# Patient Record
Sex: Male | Born: 1960
Health system: Southern US, Community
[De-identification: ages and names within clinical notes are randomized; demographics above are authoritative.]

## PROBLEM LIST (undated history)

## (undated) DIAGNOSIS — I209 Angina pectoris, unspecified: Secondary | ICD-10-CM

## (undated) DIAGNOSIS — I739 Peripheral vascular disease, unspecified: Secondary | ICD-10-CM

## (undated) DIAGNOSIS — N3281 Overactive bladder: Secondary | ICD-10-CM

## (undated) DIAGNOSIS — E785 Hyperlipidemia, unspecified: Secondary | ICD-10-CM

## (undated) DIAGNOSIS — R0989 Other specified symptoms and signs involving the circulatory and respiratory systems: Secondary | ICD-10-CM

## (undated) DIAGNOSIS — N400 Enlarged prostate without lower urinary tract symptoms: Secondary | ICD-10-CM

## (undated) DIAGNOSIS — I82409 Acute embolism and thrombosis of unspecified deep veins of unspecified lower extremity: Secondary | ICD-10-CM

## (undated) DIAGNOSIS — K219 Gastro-esophageal reflux disease without esophagitis: Secondary | ICD-10-CM

## (undated) DIAGNOSIS — T7840XA Allergy, unspecified, initial encounter: Secondary | ICD-10-CM

## (undated) DIAGNOSIS — M1990 Unspecified osteoarthritis, unspecified site: Secondary | ICD-10-CM

## (undated) HISTORY — PX: CIRCUMCISION: SUR203

## (undated) HISTORY — DX: Allergy, unspecified, initial encounter: T78.40XA

## (undated) HISTORY — DX: Unspecified osteoarthritis, unspecified site: M19.90

## (undated) HISTORY — PX: VASCULAR SURGERY: SHX849

## (undated) HISTORY — PX: LASER ABLATION OF VASCULAR LESION: SHX1950

## (undated) HISTORY — DX: Hyperlipidemia, unspecified: E78.5

## (undated) HISTORY — DX: Gastro-esophageal reflux disease without esophagitis: K21.9

---

## 1997-12-22 HISTORY — PX: CARDIAC CATHETERIZATION: SHX172

## 1997-12-22 HISTORY — PX: CORONARY ANGIOPLASTY WITH STENT PLACEMENT: SHX49

## 2019-11-18 ENCOUNTER — Ambulatory Visit (HOSPITAL_COMMUNITY)
Admission: EM | Admit: 2019-11-18 | Discharge: 2019-11-18 | Disposition: A | Discharge status: Home / Self Care | Payer: Self-pay | Attending: Family Medicine | Admitting: Family Medicine

## 2019-11-18 ENCOUNTER — Encounter (HOSPITAL_COMMUNITY): Payer: Self-pay

## 2019-11-18 ENCOUNTER — Other Ambulatory Visit: Payer: Self-pay

## 2019-11-18 DIAGNOSIS — Z720 Tobacco use: Secondary | ICD-10-CM

## 2019-11-18 DIAGNOSIS — M7918 Myalgia, other site: Secondary | ICD-10-CM

## 2019-11-18 HISTORY — DX: Overactive bladder: N32.81

## 2019-11-18 HISTORY — DX: Other specified symptoms and signs involving the circulatory and respiratory systems: R09.89

## 2019-11-18 MED ORDER — CYCLOBENZAPRINE HCL 10 MG PO TABS: 10.0000 mg | ORAL_TABLET | Freq: Two times a day (BID) | ORAL | 0 refills | Status: DC | PRN

## 2019-11-18 MED ORDER — MELOXICAM 7.5 MG PO TABS: 7.5000 mg | ORAL_TABLET | Freq: Every day | ORAL | 0 refills | Status: DC
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
## 2019-11-22 ENCOUNTER — Ambulatory Visit (INDEPENDENT_AMBULATORY_CARE_PROVIDER_SITE_OTHER): Payer: Self-pay | Admitting: Family Medicine

## 2019-11-22 ENCOUNTER — Encounter: Payer: Self-pay | Admitting: Family Medicine

## 2019-11-22 ENCOUNTER — Other Ambulatory Visit: Payer: Self-pay

## 2019-11-22 DIAGNOSIS — M542 Cervicalgia: Secondary | ICD-10-CM

## 2019-11-22 DIAGNOSIS — M25511 Pain in right shoulder: Secondary | ICD-10-CM
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
## 2019-11-28 ENCOUNTER — Ambulatory Visit: Payer: Self-pay | Admitting: Physical Therapy

## 2019-12-01 ENCOUNTER — Encounter: Payer: Self-pay | Admitting: Physical Therapy

## 2019-12-01 ENCOUNTER — Other Ambulatory Visit: Payer: Self-pay

## 2019-12-01 ENCOUNTER — Ambulatory Visit: Payer: No Typology Code available for payment source | Attending: Family Medicine | Admitting: Physical Therapy

## 2019-12-01 DIAGNOSIS — M542 Cervicalgia: Secondary | ICD-10-CM | POA: Diagnosis present

## 2019-12-01 DIAGNOSIS — M62838 Other muscle spasm: Secondary | ICD-10-CM | POA: Diagnosis present

## 2019-12-01 DIAGNOSIS — M25511 Pain in right shoulder: Secondary | ICD-10-CM | POA: Diagnosis present

## 2019-12-01 DIAGNOSIS — R293 Abnormal posture: Secondary | ICD-10-CM | POA: Insufficient documentation
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
## 2019-12-05 ENCOUNTER — Encounter: Payer: Self-pay | Admitting: Physical Therapy

## 2019-12-05 ENCOUNTER — Ambulatory Visit: Payer: No Typology Code available for payment source | Admitting: Physical Therapy

## 2019-12-05 ENCOUNTER — Other Ambulatory Visit: Payer: Self-pay

## 2019-12-05 DIAGNOSIS — M542 Cervicalgia: Secondary | ICD-10-CM

## 2019-12-05 DIAGNOSIS — M62838 Other muscle spasm: Secondary | ICD-10-CM

## 2019-12-05 DIAGNOSIS — M25511 Pain in right shoulder: Secondary | ICD-10-CM

## 2019-12-05 DIAGNOSIS — R293 Abnormal posture: Secondary | ICD-10-CM
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
## 2019-12-06 ENCOUNTER — Ambulatory Visit: Payer: No Typology Code available for payment source

## 2019-12-08 ENCOUNTER — Ambulatory Visit: Payer: No Typology Code available for payment source

## 2019-12-08 ENCOUNTER — Other Ambulatory Visit: Payer: Self-pay

## 2019-12-08 DIAGNOSIS — M25511 Pain in right shoulder: Secondary | ICD-10-CM

## 2019-12-08 DIAGNOSIS — M62838 Other muscle spasm: Secondary | ICD-10-CM

## 2019-12-08 DIAGNOSIS — R293 Abnormal posture: Secondary | ICD-10-CM

## 2019-12-08 DIAGNOSIS — M542 Cervicalgia: Secondary | ICD-10-CM | POA: Diagnosis not present

## 2019-12-08 NOTE — Therapy (Signed)
[facility] [address]  [address] [place], [place], [address] Phone: [phone]   Fax:  [phone]  Physical Therapy Treatment  Patient Details  Name: Matthew Gaines MRN: [id number] Date of Birth: Jul 31, 1961 Referring Provider (PT): Lavada Mesi, MD   Encounter Date: 12/08/2019  PT End of Session - 12/08/19 1506    Visit Number  3    Number of Visits  13    Date for PT Re-Evaluation  01/12/20    Authorization Type  MVA/self pay    PT Start Time  1502   Pt. arrived 17 min late to session   PT Stop Time  1530    PT Time Calculation (min)  28 min    Activity Tolerance  Patient tolerated treatment well;Patient limited by pain    Behavior During Therapy  [facility] for tasks assessed/performed       Past Medical History:  Diagnosis Date  . Overactive bladder   . Poor circulation     Past Surgical History:  Procedure Laterality Date  . cardiac stint    . CIRCUMCISION    . LASER ABLATION OF VASCULAR LESION    . VASCULAR SURGERY      There were no vitals filed for this visit.  Subjective Assessment - 12/08/19 1511    Subjective  Pt. doing well.    Pertinent History  vascular surgery    Diagnostic tests  none    Patient Stated Goals  play tennis and basketball    Currently in Pain?  Yes    Pain Score  --   up to 8-9/10   Pain Location  Scapula    Pain Orientation  Right    Pain Descriptors / Indicators  Dull    Pain Type  Acute pain                       [facility] Adult PT Treatment/Exercise - 12/08/19 0001      Neck Exercises: Seated   Neck Retraction  10 reps    Neck Retraction Limitations  5" x 10 reps     Cervical Rotation  Right;Left;10 reps    Cervical Rotation Limitations  AROM       Shoulder Exercises: Seated   Retraction  Strengthening;Both;10 reps    Retraction Limitations  5" x 10 reps      Shoulder Exercises: Pulleys   Flexion  2 minutes    Flexion Limitations  to tolerance    Scaption  2 minutes    Scaption Limitations  to tolerance      Shoulder Exercises: Stretch   Other Shoulder Stretches  B UT, LS stretch x 30 sec      Other Shoulder Stretches  R lats stretch 2x30"; with rolling stool               PT Short Term Goals - 12/05/19 1701      PT SHORT TERM GOAL #1   Title  Patient to be independent with initial HEP.    Time  3    Period  Weeks    Status  On-going        PT Long Term Goals - 12/05/19 1701      PT LONG TERM GOAL #1   Title  Patient to be independent with advanced HEP.    Time  6    Period  Weeks    Status  On-going      PT  LONG TERM GOAL #2   Title  Patient to demonstrate B shoulder strength >/=4+/5.    Time  6    Period  Weeks    Status  On-going      PT LONG TERM GOAL #3   Title  Patient to demonstrate cervical and R shoulder AROM WFL and painless.    Time  6    Period  Weeks    Status  On-going      PT LONG TERM GOAL #4   Title  Patient to report 80% improvement in pain with lifting household objects overhead.    Time  6    Period  Weeks    Status  On-going      PT LONG TERM GOAL #5   Title  Patient to return to tennis and basketball with modifications as needed.    Time  6    Period  Weeks    Status  On-going            Plan - 12/08/19 1522    Clinical Impression Statement  Pt. arrived 17 min late to session thus treatment time limited.  Session focused on progression of cervical AROM activities and cervical/scapular flexibility.  Pt. able to demo good overall technique with scap. retraction, chin tucking HEP today.  Did require cueing at times for proper hold times with stretching as pt. tended to conversate and forgot proper hold times.  Ended visit with pt. noting pain returning to baseline.    Rehab Potential  Good    PT Treatment/Interventions  ADLs/Self Care Home Management;Cryotherapy;Electrical Stimulation;Iontophoresis 4mg /ml Dexamethasone;Moist Heat;Therapeutic exercise;Therapeutic  activities;Functional mobility training;Ultrasound;Neuromuscular re-education;Patient/family education;Manual techniques;Vasopneumatic Device;Taping;Energy conservation;Dry needling;Passive range of motion    PT Next Visit Plan  STM and postural strengthening    Consulted and Agree with Plan of Care  Patient       Patient will benefit from skilled therapeutic intervention in order to improve the following deficits and impairments:  Increased edema, Decreased activity tolerance, Decreased strength, Increased fascial restricitons, Impaired UE functional use, Pain, Increased muscle spasms, Improper body mechanics, Decreased range of motion, Impaired flexibility, Postural dysfunction  Visit Diagnosis: Cervicalgia  Acute pain of right shoulder  Abnormal posture  Other muscle spasm     Problem List There are no problems to display for this patient.   Bess Harvest, PTA 12/08/19 6:06 PM   [facility] [address]  [place], [place], [address] Phone: [phone]   Fax:  [phone]  Name: Durwin Davisson MRN: [id number] Date of Birth: 1961/12/02

## 2019-12-13 ENCOUNTER — Ambulatory Visit: Payer: No Typology Code available for payment source

## 2019-12-13 ENCOUNTER — Other Ambulatory Visit: Payer: Self-pay

## 2019-12-13 DIAGNOSIS — M542 Cervicalgia: Secondary | ICD-10-CM

## 2019-12-13 DIAGNOSIS — M62838 Other muscle spasm: Secondary | ICD-10-CM

## 2019-12-13 DIAGNOSIS — M25511 Pain in right shoulder: Secondary | ICD-10-CM

## 2019-12-13 DIAGNOSIS — R293 Abnormal posture: Secondary | ICD-10-CM

## 2019-12-13 NOTE — Therapy (Signed)
[facility] [address]  [place], [place], [address] Phone: [phone]   Fax:  [phone]  Physical Therapy Treatment  Patient Details  Name: Matthew Gaines MRN: [id number] Date of Birth: [birth year] Referring Provider (PT): Eunice Blase, MD   Encounter Date: 12/13/2019  PT End of Session - 12/13/19 1500    Visit Number  4    Number of Visits  13    Date for PT Re-Evaluation  01/12/20    Authorization Type  MVA/self pay    PT Start Time  3220   pt. arrived late to session   PT Stop Time  1532    PT Time Calculation (min)  38 min    Activity Tolerance  Patient tolerated treatment well;Patient limited by pain    Behavior During Therapy  [facility] for tasks assessed/performed       Past Medical History:  Diagnosis Date  . Overactive bladder   . Poor circulation     Past Surgical History:  Procedure Laterality Date  . cardiac stint    . CIRCUMCISION    . LASER ABLATION OF VASCULAR LESION    . VASCULAR SURGERY      There were no vitals filed for this visit.  Subjective Assessment - 12/13/19 1458    Subjective  Pt. requesting to have abbreviated therapy session today due to not sleeping well last night after helping wife in the middle of night last night.    Pertinent History  vascular surgery    Diagnostic tests  none    Patient Stated Goals  play tennis and basketball    Currently in Pain?  Yes    Pain Score  0-No pain   Pain rising to 7.5/10 at worst while helping wife to the bathroom last night   Pain Location  Scapula                       [facility] Adult PT Treatment/Exercise - 12/13/19 0001      Shoulder Exercises: Pulleys   Flexion  3 minutes    Flexion Limitations  to tolerance    Scaption  3 minutes    Scaption Limitations  to tolerance      Shoulder Exercises: ROM/Strengthening   Cybex Row  15 reps    Cybex Row Limitations  10# - low       Manual Therapy   Manual Therapy   Soft tissue mobilization;Myofascial release    Manual therapy comments  sitting     Soft tissue mobilization  STM to R UT, LS, R mid rhomboids, R teres minor, R infraspinatus    Myofascial Release  TPR to mid rhomboids, R UT, R teres minor, infraspinatus       Neck Exercises: Stretches   Upper Trapezius Stretch  Right;Left;1 rep;30 seconds    Upper Trapezius Stretch Limitations  B hands holding down on machine seat     Levator Stretch  Right;Left;1 rep;30 seconds    Levator Stretch Limitations  B hands holding down on machine seat    Corner Stretch  2 reps;30 seconds   low in doorway             PT Education - 12/13/19 1539    Education Details  HEP update;  posterior/inferior shoulder stretch    Person(s) Educated  Patient    Methods  Explanation;Demonstration;Verbal cues;Handout    Comprehension  Verbalized understanding;Returned demonstration;Verbal cues required  PT Short Term Goals - 12/13/19 1642      PT SHORT TERM GOAL #1   Title  Patient to be independent with initial HEP.    Time  3    Period  Weeks    Status  Achieved        PT Long Term Goals - 12/05/19 1701      PT LONG TERM GOAL #1   Title  Patient to be independent with advanced HEP.    Time  6    Period  Weeks    Status  On-going      PT LONG TERM GOAL #2   Title  Patient to demonstrate B shoulder strength >/=4+/5.    Time  6    Period  Weeks    Status  On-going      PT LONG TERM GOAL #3   Title  Patient to demonstrate cervical and R shoulder AROM WFL and painless.    Time  6    Period  Weeks    Status  On-going      PT LONG TERM GOAL #4   Title  Patient to report 80% improvement in pain with lifting household objects overhead.    Time  6    Period  Weeks    Status  On-going      PT LONG TERM GOAL #5   Title  Patient to return to tennis and basketball with modifications as needed.    Time  6    Period  Weeks    Status  On-going            Plan - 12/13/19 1512     Clinical Impression Statement  Tacari arrived late to session thus treatment time limited.  Tolerated progression of cervical stretching and addition of MT focused on R shoulder muscular guarding with good response.  Ended session today pain free and verbalized/demonstrating improved understanding of HEP.  Will continue to progress toward goals.    Rehab Potential  Good    PT Treatment/Interventions  ADLs/Self Care Home Management;Cryotherapy;Electrical Stimulation;Iontophoresis 4mg /ml Dexamethasone;Moist Heat;Therapeutic exercise;Therapeutic activities;Functional mobility training;Ultrasound;Neuromuscular re-education;Patient/family education;Manual techniques;Vasopneumatic Device;Taping;Energy conservation;Dry needling;Passive range of motion    PT Next Visit Plan  STM and postural strengthening    Consulted and Agree with Plan of Care  Patient       Patient will benefit from skilled therapeutic intervention in order to improve the following deficits and impairments:  Increased edema, Decreased activity tolerance, Decreased strength, Increased fascial restricitons, Impaired UE functional use, Pain, Increased muscle spasms, Improper body mechanics, Decreased range of motion, Impaired flexibility, Postural dysfunction  Visit Diagnosis: Cervicalgia  Acute pain of right shoulder  Abnormal posture  Other muscle spasm     Problem List There are no problems to display for this patient.   , PTA 12/13/19 6:24 PM   [facility] [address]  [address] [place], [place], [place] Phone: [phone]   Fax:  [phone]  Name: Matthew Gaines MRN: [name] Date of Birth: Mar 25, 1961

## 2019-12-15 ENCOUNTER — Ambulatory Visit: Payer: No Typology Code available for payment source | Admitting: Physical Therapy

## 2019-12-20 ENCOUNTER — Ambulatory Visit: Payer: No Typology Code available for payment source

## 2019-12-20 ENCOUNTER — Other Ambulatory Visit: Payer: Self-pay

## 2019-12-20 DIAGNOSIS — M62838 Other muscle spasm: Secondary | ICD-10-CM

## 2019-12-20 DIAGNOSIS — M25511 Pain in right shoulder: Secondary | ICD-10-CM

## 2019-12-20 DIAGNOSIS — R293 Abnormal posture: Secondary | ICD-10-CM

## 2019-12-20 DIAGNOSIS — M542 Cervicalgia: Secondary | ICD-10-CM

## 2019-12-20 NOTE — Therapy (Signed)
[facility] [address]  [place], [place], [address] Phone: [phone]   Fax:  [phone]  Physical Therapy Treatment  Patient Details  Name: Matthew Gaines MRN: [id number] Date of Birth: Jun 05, 1961 Referring Provider (PT): Eunice Blase, MD   Encounter Date: 12/20/2019  PT End of Session - 12/20/19 1504    Visit Number  5    Number of Visits  13    Date for PT Re-Evaluation  01/12/20    Authorization Type  MVA/self pay    PT Start Time  2505    PT Stop Time  1534    PT Time Calculation (min)  38 min    Activity Tolerance  Patient tolerated treatment well;Patient limited by pain    Behavior During Therapy  [facility] for tasks assessed/performed       Past Medical History:  Diagnosis Date  . Overactive bladder   . Poor circulation     Past Surgical History:  Procedure Laterality Date  . cardiac stint    . CIRCUMCISION    . LASER ABLATION OF VASCULAR LESION    . VASCULAR SURGERY      There were no vitals filed for this visit.  Subjective Assessment - 12/20/19 1502    Subjective  Pt. reporting he came straight to therapy from funeral for family member.    Pertinent History  vascular surgery    Diagnostic tests  none    Patient Stated Goals  play tennis and basketball    Currently in Pain?  No/denies    Pain Score  0-No pain   up to 7/10   Pain Location  Scapula    Pain Orientation  Right    Pain Descriptors / Indicators  Dull    Pain Type  Acute pain    Pain Radiating Towards  Towards R lats muscle    Multiple Pain Sites  Yes    Pain Score  0   up to 5/10 pain at worst   Pain Location  Neck    Pain Orientation  Right;Lower;Posterior    Pain Descriptors / Indicators  Dull;Sharp    Pain Type  Acute pain                       [facility] Adult PT Treatment/Exercise - 12/20/19 0001      Neck Exercises: Machines for Strengthening   UBE (Upper Arm Bike)  Lvl 1.0, 3 min forward/3 min  backwards       Neck Exercises: Theraband   Scapula Retraction  10 reps   terminated as pt. with R lats cramp x 2    Scapula Retraction Limitations  laying on pool noodle     Shoulder External Rotation  10 reps   yellow TB    Shoulder External Rotation Limitations  Hooklying on table     Horizontal ABduction  10 reps   red TB    Horizontal ABduction Limitations  Hooklying on table       Shoulder Exercises: Stretch   Corner Stretch  2 reps;30 seconds    Corner Stretch Limitations  low and mid     Other Shoulder Stretches  R lats stretch 2x30" with green p-ball rollouts       Manual Therapy   Manual Therapy  Soft tissue mobilization;Taping    Manual therapy comments  sitting     Soft tissue mobilization  STM to R distal lats  Kinesiotex  IT consultant  R lats k-taping pattern; "star pattern" (30% stretch all strips)      Neck Exercises: Stretches   Upper Trapezius Stretch  Right;Left;1 rep;30 seconds    Levator Stretch  Right;Left;1 rep;30 seconds    Chest Stretch  30 seconds;2 reps    Chest Stretch Limitations  snow angel position for chest stretch                PT Short Term Goals - 12/13/19 1642      PT SHORT TERM GOAL #1   Title  Patient to be independent with initial HEP.    Time  3    Period  Weeks    Status  Achieved        PT Long Term Goals - 12/05/19 1701      PT LONG TERM GOAL #1   Title  Patient to be independent with advanced HEP.    Time  6    Period  Weeks    Status  On-going      PT LONG TERM GOAL #2   Title  Patient to demonstrate B shoulder strength >/=4+/5.    Time  6    Period  Weeks    Status  On-going      PT LONG TERM GOAL #3   Title  Patient to demonstrate cervical and R shoulder AROM WFL and painless.    Time  6    Period  Weeks    Status  On-going      PT LONG TERM GOAL #4   Title  Patient to report 80% improvement in pain with lifting household objects overhead.    Time  6    Period   Weeks    Status  On-going      PT LONG TERM GOAL #5   Title  Patient to return to tennis and basketball with modifications as needed.    Time  6    Period  Weeks    Status  On-going            Plan - 12/20/19 1506    Clinical Impression Statement  Shady reporting some "popping" in shoulder.  Did have palpable "popping" in superior shoulder with therex today however this did not seem to limit him in session.  Did have R lats cramping x 2 with hooklying scapular activities thus adjusted for improved tolerance.  Required cueing to avoid pain and excessive overpressure with mid doorway pec stretch today.  Pt. verbalizing understanding of cueing today with good carryover.  Seems to be tolerating gentle cervical/shoulder ROM and gentle strengthening well.     Rehab Potential  Good    PT Treatment/Interventions  ADLs/Self Care Home Management;Cryotherapy;Electrical Stimulation;Iontophoresis 4mg /ml Dexamethasone;Moist Heat;Therapeutic exercise;Therapeutic activities;Functional mobility training;Ultrasound;Neuromuscular re-education;Patient/family education;Manual techniques;Vasopneumatic Device;Taping;Energy conservation;Dry needling;Passive range of motion    PT Next Visit Plan  STM and postural strengthening    Consulted and Agree with Plan of Care  Patient       Patient will benefit from skilled therapeutic intervention in order to improve the following deficits and impairments:  Increased edema, Decreased activity tolerance, Decreased strength, Increased fascial restricitons, Impaired UE functional use, Pain, Increased muscle spasms, Improper body mechanics, Decreased range of motion, Impaired flexibility, Postural dysfunction  Visit Diagnosis: Cervicalgia  Acute pain of right shoulder  Abnormal posture  Other muscle spasm     Problem List There are no problems to display  for this patient.   Kermit BaloMicah Toluwani Ruder, PTA 12/20/19 6:42 PM    [facility]  [facility] [address]  [address] [place], [place], [address] Phone: [phone]   Fax:  [phone]  Name: Matthew PrestoLarry Gaines MRN: [id number] Date of Birth: 11/23/1961

## 2019-12-22 ENCOUNTER — Ambulatory Visit: Payer: No Typology Code available for payment source | Admitting: Physical Therapy

## 2019-12-27 ENCOUNTER — Ambulatory Visit: Payer: 59 | Attending: Family Medicine

## 2019-12-27 ENCOUNTER — Ambulatory Visit: Payer: 59

## 2019-12-27 ENCOUNTER — Other Ambulatory Visit: Payer: Self-pay

## 2019-12-27 DIAGNOSIS — R293 Abnormal posture: Secondary | ICD-10-CM | POA: Insufficient documentation

## 2019-12-27 DIAGNOSIS — M25511 Pain in right shoulder: Secondary | ICD-10-CM | POA: Insufficient documentation

## 2019-12-27 DIAGNOSIS — M542 Cervicalgia: Secondary | ICD-10-CM | POA: Diagnosis not present

## 2019-12-27 DIAGNOSIS — M62838 Other muscle spasm: Secondary | ICD-10-CM

## 2019-12-27 NOTE — Therapy (Signed)
[facility] [address]  [place], [place], [address] Phone: [phone]   Fax:  [phone]  Physical Therapy Treatment  Patient Details  Name: Matthew Gaines MRN: [id number] Date of Birth: [birth year] Referring Provider (PT): Eunice Blase, MD   Encounter Date: 12/27/2019  PT End of Session - 12/27/19 1643    Visit Number  6    Number of Visits  13    Date for PT Re-Evaluation  01/12/20    Authorization Type  MVA/self pay    PT Start Time  1616    PT Stop Time  1715    PT Time Calculation (min)  59 min    Activity Tolerance  Patient tolerated treatment well;Patient limited by pain    Behavior During Therapy  [facility] for tasks assessed/performed       Past Medical History:  Diagnosis Date  . Overactive bladder   . Poor circulation     Past Surgical History:  Procedure Laterality Date  . cardiac stint    . CIRCUMCISION    . LASER ABLATION OF VASCULAR LESION    . VASCULAR SURGERY      There were no vitals filed for this visit.  Subjective Assessment - 12/27/19 1621    Subjective  Pt. reporting he is not having to care for his wife now.    Pertinent History  vascular surgery    Diagnostic tests  none    Patient Stated Goals  play tennis and basketball    Currently in Pain?  No/denies    Pain Score  0-No pain   Pain rising to 7/10 with removing christmas decor   Pain Location  Scapula    Pain Orientation  Right    Pain Radiating Towards  towards R lats muscle    Pain Onset  More than a month ago    Pain Frequency  Constant    Aggravating Factors   removing Christmas decor    Multiple Pain Sites  No                       [facility] Adult PT Treatment/Exercise - 12/27/19 0001      Neck Exercises: Machines for Strengthening   UBE (Upper Arm Bike)  Lvl 1.0, 3 min forward/3 min backwards     Cybex Row  10# - low row15 reps     Lat Pull  10# x narrow pulldown    Cues for full scap.  retraction/depression      Modalities   Modalities  Moist Heat;Electrical Stimulation      Moist Heat Therapy   Number Minutes Moist Heat  15 Minutes    Moist Heat Location  Shoulder   R UT/shoulder blade in seated      Electrical Stimulation   Electrical Stimulation Location  R shoulder blade     Electrical Stimulation Action  IFC    Electrical Stimulation Parameters  80-150Hz , intensity to pt. tolerance, 15'    Electrical Stimulation Goals  Pain      Manual Therapy   Manual Therapy  Soft tissue mobilization;Taping    Manual therapy comments  prone    Soft tissue mobilization  STM to R distal lats     Kinesiotex  IT sales professional  R lats k-taping pattern; "star pattern" (30% stretch all strips)   noted relief from K-taping to R lats  Neck Exercises: Stretches   Upper Trapezius Stretch  Right;Left;1 rep;30 seconds    Upper Trapezius Stretch Limitations  hands holding edge on edge of mat table     Levator Stretch  Right;Left;1 rep;30 seconds    Levator Stretch Limitations  hands holding onto edge of mat table     Corner Stretch  2 reps;30 seconds   on doorframe               PT Short Term Goals - 12/13/19 1642      PT SHORT TERM GOAL #1   Title  Patient to be independent with initial HEP.    Time  3    Period  Weeks    Status  Achieved        PT Long Term Goals - 12/05/19 1701      PT LONG TERM GOAL #1   Title  Patient to be independent with advanced HEP.    Time  6    Period  Weeks    Status  On-going      PT LONG TERM GOAL #2   Title  Patient to demonstrate B shoulder strength >/=4+/5.    Time  6    Period  Weeks    Status  On-going      PT LONG TERM GOAL #3   Title  Patient to demonstrate cervical and R shoulder AROM WFL and painless.    Time  6    Period  Weeks    Status  On-going      PT LONG TERM GOAL #4   Title  Patient to report 80% improvement in pain with lifting household objects overhead.    Time  6     Period  Weeks    Status  On-going      PT LONG TERM GOAL #5   Title  Patient to return to tennis and basketball with modifications as needed.    Time  6    Period  Weeks    Status  On-going            Plan - 12/27/19 1656    Clinical Impression Statement  Pt. reporting benefit from K-taping to R lats last session thus reapplied taping in session with no skin irritation visible.  MT addressing tenderness in R lats with gentle STM to this area which was tolerated well.  Progressed postural/scapular strengthening today with brief episode of R shoulder "popping" which was noted at 2/10 pain and quickly subsided.  Pt. with tenderness and increased tension in R UT, cervical musculature today thus trialed combo of moist heat/E-stim to R upper shoulder with good relief noted following these modalities.     Rehab Potential  Good    PT Treatment/Interventions  ADLs/Self Care Home Management;Cryotherapy;Electrical Stimulation;Iontophoresis 4mg /ml Dexamethasone;Moist Heat;Therapeutic exercise;Therapeutic activities;Functional mobility training;Ultrasound;Neuromuscular re-education;Patient/family education;Manual techniques;Vasopneumatic Device;Taping;Energy conservation;Dry needling;Passive range of motion    PT Next Visit Plan  STM and postural strengthening    Consulted and Agree with Plan of Care  Patient       Patient will benefit from skilled therapeutic intervention in order to improve the following deficits and impairments:  Increased edema, Decreased activity tolerance, Decreased strength, Increased fascial restricitons, Impaired UE functional use, Pain, Increased muscle spasms, Improper body mechanics, Decreased range of motion, Impaired flexibility, Postural dysfunction  Visit Diagnosis: Cervicalgia  Acute pain of right shoulder  Abnormal posture  Other muscle spasm     Problem List There are no problems to display for  this patient.   Kermit Balo, PTA 12/27/19 6:32  PM   [facility] [address]  [address] [place], [place], [address] Phone: [phone]   Fax:  [phone]  Name: Matthew Gaines MRN: [id number] Date of Birth: 11/19/1961

## 2019-12-27 NOTE — Patient Instructions (Signed)
TENS stands for Transcutaneous Electrical Nerve Stimulation. In other words, electrical impulses are allowed to pass through the skin in order to excite a nerve.  ° °Purpose and Use of TENS:  °TENS is a method used to manage acute and chronic pain without the use of drugs. It has been effective in managing pain associated with surgery, sprains, strains, trauma, rheumatoid arthritis, and neuralgias. It is a non-addictive, low risk, and non-invasive technique used to control pain. It is not, by any means, a curative form of treatment.  ° °How TENS Works:  °Most TENS units are a small pocket-sized unit powered by one 9 volt battery. Attached to the outside of the unit are two lead wires where two pins and/or snaps connect on each wire. All units come with a set of four reusable pads or electrodes. These are placed on the skin surrounding the area involved. By inserting the leads into  the pads, the electricity can pass from the unit making the circuit complete.  °As the intensity is turned up slowly, the electrical current enters the body from the electrodes through the skin to the surrounding nerve fibers. This triggers the release of hormones from within the body. These hormones contain pain relievers. By increasing the circulation of these hormones, the person’s pain may be lessened. It is also believed that the electrical stimulation itself helps to block the pain messages being sent to the brain, thus also decreasing the body’s perception of pain.  ° °Hazards:  °TENS units are NOT to be used by patients with PACEMAKERS, DEFIBRILLATORS, DIABETIC PUMPS, PREGNANT WOMEN, and patients with SEIZURE DISORDERS.  °TENS units are NOT to be used over the heart, throat, brain, or spinal cord.  °One of the major side effects from the TENS unit may be skin irritation. Some people may develop a rash if they are sensitive to the materials used in the electrodes or the connecting wires.  ° °Wear the unit for 15 min.  ° °Avoid  overuse due the body getting used to the stem making it not as effective over time.  ° °

## 2019-12-29 ENCOUNTER — Ambulatory Visit: Payer: 59 | Admitting: Physical Therapy

## 2019-12-29 ENCOUNTER — Ambulatory Visit: Payer: 59

## 2019-12-29 DIAGNOSIS — R293 Abnormal posture: Secondary | ICD-10-CM

## 2019-12-29 DIAGNOSIS — M62838 Other muscle spasm: Secondary | ICD-10-CM

## 2019-12-29 DIAGNOSIS — M25511 Pain in right shoulder: Secondary | ICD-10-CM

## 2019-12-29 DIAGNOSIS — M542 Cervicalgia: Secondary | ICD-10-CM
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
## 2020-01-03 ENCOUNTER — Ambulatory Visit: Payer: 59

## 2020-01-04 ENCOUNTER — Ambulatory Visit: Payer: 59

## 2020-01-04 ENCOUNTER — Other Ambulatory Visit: Payer: Self-pay

## 2020-01-04 DIAGNOSIS — M62838 Other muscle spasm: Secondary | ICD-10-CM

## 2020-01-04 DIAGNOSIS — M25511 Pain in right shoulder: Secondary | ICD-10-CM

## 2020-01-04 DIAGNOSIS — M542 Cervicalgia: Secondary | ICD-10-CM

## 2020-01-04 DIAGNOSIS — R293 Abnormal posture: Secondary | ICD-10-CM
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
## 2020-01-05 ENCOUNTER — Ambulatory Visit: Payer: 59 | Admitting: Physical Therapy

## 2020-01-09 ENCOUNTER — Telehealth: Payer: Self-pay | Admitting: Family Medicine

## 2020-01-09 NOTE — Telephone Encounter (Signed)
Left message on patient's voice mail to call back to discuss.

## 2020-01-09 NOTE — Telephone Encounter (Signed)
Patient called stated he finished all PT that was ordered.Wants to know what to do from here.  Please call [phone]

## 2020-01-09 NOTE — Telephone Encounter (Signed)
Patient called.  He was returning the call he missed earlier today.   Call back number: [phone]

## 2020-01-09 NOTE — Telephone Encounter (Signed)
I tried calling the patient again - reached his voice mail. I will try again later.

## 2020-01-09 NOTE — Telephone Encounter (Signed)
I called the patient. He had some improvement with PT, but continues to have pain and clicking in the right shoulder. Scheduled an appointment with Dr. Prince Rome for follow up/possible xrays of both the neck and right shoulder tomorrow afternoon at 3:00.

## 2020-01-09 NOTE — Telephone Encounter (Signed)
I spoke with the patient. See other message on this from today. 

## 2020-01-10 ENCOUNTER — Ambulatory Visit: Payer: Self-pay | Admitting: Family Medicine

## 2020-01-11 ENCOUNTER — Encounter: Payer: Self-pay | Admitting: Family Medicine

## 2020-01-11 ENCOUNTER — Ambulatory Visit (INDEPENDENT_AMBULATORY_CARE_PROVIDER_SITE_OTHER): Payer: 59 | Admitting: Family Medicine

## 2020-01-11 ENCOUNTER — Other Ambulatory Visit: Payer: Self-pay

## 2020-01-11 DIAGNOSIS — M542 Cervicalgia: Secondary | ICD-10-CM

## 2020-01-11 DIAGNOSIS — M25511 Pain in right shoulder: Secondary | ICD-10-CM
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
## 2020-01-14 ENCOUNTER — Encounter (HOSPITAL_BASED_OUTPATIENT_CLINIC_OR_DEPARTMENT_OTHER): Payer: Self-pay

## 2020-01-14 ENCOUNTER — Emergency Department (HOSPITAL_BASED_OUTPATIENT_CLINIC_OR_DEPARTMENT_OTHER)
Admission: EM | Admit: 2020-01-14 | Discharge: 2020-01-14 | Disposition: A | Discharge status: Home / Self Care | Payer: 59 | Attending: Emergency Medicine | Admitting: Emergency Medicine

## 2020-01-14 ENCOUNTER — Other Ambulatory Visit: Payer: Self-pay

## 2020-01-14 DIAGNOSIS — R360 Urethral discharge without blood: Secondary | ICD-10-CM | POA: Insufficient documentation

## 2020-01-14 DIAGNOSIS — R300 Dysuria: Secondary | ICD-10-CM | POA: Insufficient documentation

## 2020-01-14 DIAGNOSIS — Z202 Contact with and (suspected) exposure to infections with a predominantly sexual mode of transmission: Secondary | ICD-10-CM | POA: Diagnosis not present

## 2020-01-14 LAB — URINALYSIS, MICROSCOPIC (REFLEX): WBC, UA: >50 WBC/hpf (ref 0–5)

## 2020-01-14 LAB — URINALYSIS, ROUTINE W REFLEX MICROSCOPIC
Bilirubin Urine: NEGATIVE
Glucose, UA: NEGATIVE mg/dL
Hgb urine dipstick: NEGATIVE
Ketones, ur: NEGATIVE mg/dL
Nitrite: NEGATIVE
Protein, ur: NEGATIVE mg/dL
Specific Gravity, Urine: 1.025 (ref 1.005–1.030)
pH: 6 (ref 5.0–8.0)

## 2020-01-14 MED ORDER — AZITHROMYCIN 1 G PO PACK
1.0000 g | PACK | Freq: Once | ORAL | Status: AC
  Administered 2020-01-14: 1 g via ORAL
  Filled 2020-01-14: qty 1

## 2020-01-14 MED ORDER — CEFTRIAXONE SODIUM 500 MG IJ SOLR
500.0000 mg | Freq: Once | INTRAMUSCULAR | Status: AC
  Administered 2020-01-14: 500 mg via INTRAMUSCULAR
  Filled 2020-01-14: qty 500

## 2020-01-14 NOTE — Discharge Instructions (Addendum)
You have been treated presumptively today for gonorrhea and chlamydia.   You have been tested today for gonorrhea and chlamydia. These results will be available in approximately 3 days. You may check your MyChart account for results. Please inform all sexual partners of positive results and that they should be tested and treated as well.  Please wait 2 weeks and be sure that you and your partners are symptom free before returning to sexual activity. Please use protection with every sexual encounter.  Follow Up: Please followup with your primary doctor in 3 days for discussion of your diagnoses and further evaluation after today's visit; if you do not have a primary care doctor use the resource guide provided to find one; Please return to the ER for worsening symptoms, high fevers or persistent vomiting.  Please try calling [phone] to determine if [facility] accepts your insurance.  You can request Matthew Gaines or any of the providers there.  Your best bet however is to contact your insurance company to determine who in the area accepts your insurance. It is imperative that you get established with somebody, particularly if you would like PSA testing.

## 2020-01-14 NOTE — ED Provider Notes (Signed)
[facility] EMERGENCY DEPARTMENT Provider Note   CSN: [id number] Arrival date & time: 01/14/20  1000     History Chief Complaint  Patient presents with  . Exposure to STD    Matthew Gaines is a 59 y.o. male with no significant PMH who presents to the ED with 4-day history of mild penile discharge, testicular swelling, and dysuria.  Patient reports that he has been sexually active with multiple partners in the past 6 weeks and has not used protection.  He denies any fevers or chills, pain with defecation, abdominal pain, nausea or vomiting, bloody stool, hematuria, increased urinary frequency, or other symptoms.  He is requesting STI testing.  HPI     Past Medical History:  Diagnosis Date  . Overactive bladder   . Poor circulation     There are no problems to display for this patient.   Past Surgical History:  Procedure Laterality Date  . cardiac stint    . CIRCUMCISION    . LASER ABLATION OF VASCULAR LESION    . VASCULAR SURGERY         Family History  Family history unknown: Yes    Social History   Tobacco Use  . Smoking status: Current Some Day Smoker  . Smokeless tobacco: Never Used  Substance Use Topics  . Alcohol use: Never  . Drug use: Never    Home Medications Prior to Admission medications   Medication Sig Start Date End Date Taking? Authorizing Provider  cyclobenzaprine (FLEXERIL) 10 MG tablet Take 1 tablet (10 mg total) by mouth 2 (two) times daily as needed for muscle spasms. Patient not taking: Reported on 12/01/2019 11/18/19   Scot Jun, FNP  meloxicam (MOBIC) 7.5 MG tablet Take 1 tablet (7.5 mg total) by mouth daily. Patient not taking: Reported on 12/01/2019 11/18/19   Scot Jun, FNP    Allergies    Patient has no known allergies.  Review of Systems   Review of Systems  Constitutional: Negative for chills and fever.  Gastrointestinal: Negative for abdominal pain and nausea.  Genitourinary: Positive for  discharge. Negative for genital sores.    Physical Exam Updated Vital Signs BP 127/88 (BP Location: Right Arm)   Temp 98.2 F (36.8 C) (Oral)   Resp 18   Ht 6\' 2"  (1.88 m)   Wt 79.4 kg   BMI 22.47 kg/m   Physical Exam Vitals and nursing note reviewed. Exam conducted with a chaperone present.  Constitutional:      Appearance: Normal appearance. He is not ill-appearing.  HENT:     Head: Normocephalic and atraumatic.  Eyes:     General: No scleral icterus.    Conjunctiva/sclera: Conjunctivae normal.  Cardiovascular:     Rate and Rhythm: Normal rate and regular rhythm.     Pulses: Normal pulses.     Heart sounds: Normal heart sounds.  Pulmonary:     Effort: Pulmonary effort is normal. No respiratory distress.     Breath sounds: Normal breath sounds.  Abdominal:     General: Abdomen is flat. There is no distension.     Palpations: Abdomen is soft.     Tenderness: There is no abdominal tenderness. There is no guarding.  Genitourinary:    Comments: No significant penile discharge.  Circumcised.  No rashes.  No vesicular lesions.  No testicular swelling or tenderness to palpation.  Mild inguinal lymphadenopathy. Musculoskeletal:     Cervical back: Normal range of motion and neck supple. No rigidity.  Skin:    General: Skin is dry.  Neurological:     Mental Status: He is alert.     GCS: GCS eye subscore is 4. GCS verbal subscore is 5. GCS motor subscore is 6.  Psychiatric:        Mood and Affect: Mood normal.        Behavior: Behavior normal.        Thought Content: Thought content normal.     ED Results / Procedures / Treatments   Labs (all labs ordered are listed, but only abnormal results are displayed) Labs Reviewed  URINALYSIS, ROUTINE W REFLEX MICROSCOPIC - Abnormal; Notable for the following components:      Result Value   APPearance CLOUDY (*)    Leukocytes,Ua TRACE (*)    All other components within normal limits  URINALYSIS, MICROSCOPIC (REFLEX) - Abnormal;  Notable for the following components:   Bacteria, UA MANY (*)    All other components within normal limits  GC/CHLAMYDIA PROBE AMP ([place]) NOT AT [facility]    EKG None  Radiology No results found.  Procedures Procedures (including critical care time)  Medications Ordered in ED Medications  cefTRIAXone (ROCEPHIN) injection 500 mg (500 mg Intramuscular Given 01/14/20 1052)  azithromycin (ZITHROMAX) powder 1 g (1 g Oral Given 01/14/20 1051)    ED Course  I have reviewed the triage vital signs and the nursing notes.  Pertinent labs & imaging results that were available during my care of the patient were reviewed by me and considered in my medical decision making (see chart for details).    MDM Rules/Calculators/A&P                      Offered HIV and syphilis testing, to which patient declined.  Patient is requesting PSA testing and I encouraged him to get established with a primary care provider.  I encouraged him to contact his insurance company to find out which providers in the area accept his insurance.  His UA was unremarkable.  Patient is afebrile without abdominal tenderness, abdominal pain or painful bowel movements to indicate prostatitis.  No tenderness to palpation of the testes or epididymis to suggest orchitis or epididymitis.  STD cultures obtained including gonorrhea and chlamydia. Patient to be discharged with instructions to follow up with PCP. Discussed importance of using protection when sexually active. Pt understands that they have GC/Chlamydia cultures pending and that they will need to inform all sexual partners if results return positive. Patient has been treated prophylactically with azithromycin and Rocephin.    All of the evaluation and work-up results were discussed with the patient and any family at bedside. They were provided opportunity to ask any additional questions and have none at this time. They have expressed understanding of verbal discharge  instructions as well as return precautions and are agreeable to the plan.    Final Clinical Impression(s) / ED Diagnoses Final diagnoses:  Possible exposure to STD    Rx / DC Orders ED Discharge Orders    None       Lorelee New, PA-C 01/14/20 1106    Terrilee Files, MD 01/14/20 1734

## 2020-01-14 NOTE — ED Triage Notes (Signed)
Pt states that he is here today to be checked for an STD. States that someone he was with was positive for Gonorrhea.

## 2020-01-16 LAB — GC/CHLAMYDIA PROBE AMP (~~LOC~~) NOT AT ARMC
Chlamydia: NEGATIVE
Neisseria Gonorrhea: POSITIVE — AB

## 2020-01-17 ENCOUNTER — Other Ambulatory Visit: Payer: Self-pay

## 2020-01-17 ENCOUNTER — Ambulatory Visit: Payer: 59 | Attending: Family Medicine | Admitting: Physical Therapy

## 2020-01-17 ENCOUNTER — Encounter: Payer: Self-pay | Admitting: Physical Therapy

## 2020-01-17 DIAGNOSIS — R293 Abnormal posture: Secondary | ICD-10-CM | POA: Diagnosis present

## 2020-01-17 DIAGNOSIS — M62838 Other muscle spasm: Secondary | ICD-10-CM

## 2020-01-17 DIAGNOSIS — M542 Cervicalgia: Secondary | ICD-10-CM

## 2020-01-17 DIAGNOSIS — M25511 Pain in right shoulder: Secondary | ICD-10-CM
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
## 2020-01-24 ENCOUNTER — Ambulatory Visit: Payer: 59 | Attending: Family Medicine

## 2020-01-24 ENCOUNTER — Other Ambulatory Visit: Payer: Self-pay

## 2020-01-24 DIAGNOSIS — M542 Cervicalgia: Secondary | ICD-10-CM | POA: Diagnosis present

## 2020-01-24 DIAGNOSIS — M62838 Other muscle spasm: Secondary | ICD-10-CM | POA: Insufficient documentation

## 2020-01-24 DIAGNOSIS — R293 Abnormal posture: Secondary | ICD-10-CM | POA: Insufficient documentation

## 2020-01-24 DIAGNOSIS — M25511 Pain in right shoulder: Secondary | ICD-10-CM | POA: Insufficient documentation
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
## 2020-01-31 ENCOUNTER — Ambulatory Visit: Payer: 59

## 2020-02-01 ENCOUNTER — Ambulatory Visit: Payer: 59 | Admitting: Physical Therapy

## 2020-02-02 ENCOUNTER — Ambulatory Visit: Payer: 59

## 2020-02-07 ENCOUNTER — Other Ambulatory Visit: Payer: Self-pay

## 2020-02-07 ENCOUNTER — Ambulatory Visit: Payer: 59

## 2020-02-07 DIAGNOSIS — M25511 Pain in right shoulder: Secondary | ICD-10-CM | POA: Diagnosis not present

## 2020-02-07 DIAGNOSIS — R293 Abnormal posture: Secondary | ICD-10-CM

## 2020-02-07 DIAGNOSIS — M542 Cervicalgia: Secondary | ICD-10-CM

## 2020-02-07 DIAGNOSIS — M62838 Other muscle spasm: Secondary | ICD-10-CM
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
## 2020-02-11 ENCOUNTER — Other Ambulatory Visit: Payer: Self-pay

## 2020-02-11 ENCOUNTER — Encounter (HOSPITAL_BASED_OUTPATIENT_CLINIC_OR_DEPARTMENT_OTHER): Payer: Self-pay | Admitting: Emergency Medicine

## 2020-02-11 ENCOUNTER — Emergency Department (HOSPITAL_BASED_OUTPATIENT_CLINIC_OR_DEPARTMENT_OTHER)
Admission: EM | Admit: 2020-02-11 | Discharge: 2020-02-11 | Disposition: A | Discharge status: Home / Self Care | Payer: 59 | Attending: Emergency Medicine | Admitting: Emergency Medicine

## 2020-02-11 DIAGNOSIS — F17200 Nicotine dependence, unspecified, uncomplicated: Secondary | ICD-10-CM | POA: Diagnosis not present

## 2020-02-11 DIAGNOSIS — M545 Low back pain: Secondary | ICD-10-CM | POA: Insufficient documentation

## 2020-02-11 DIAGNOSIS — Y9389 Activity, other specified: Secondary | ICD-10-CM | POA: Diagnosis not present

## 2020-02-11 DIAGNOSIS — Y92410 Unspecified street and highway as the place of occurrence of the external cause: Secondary | ICD-10-CM | POA: Insufficient documentation

## 2020-02-11 DIAGNOSIS — M542 Cervicalgia: Secondary | ICD-10-CM | POA: Insufficient documentation

## 2020-02-11 DIAGNOSIS — Y999 Unspecified external cause status: Secondary | ICD-10-CM | POA: Insufficient documentation

## 2020-02-11 MED ORDER — METHOCARBAMOL 500 MG PO TABS: 500.0000 mg | ORAL_TABLET | Freq: Two times a day (BID) | ORAL | 0 refills | Status: DC

## 2020-02-11 MED ORDER — LIDOCAINE 5 % EX PTCH: 1.0000 | MEDICATED_PATCH | CUTANEOUS | 0 refills | Status: DC

## 2020-02-11 NOTE — ED Notes (Signed)
ED Provider at bedside. 

## 2020-02-11 NOTE — Discharge Instructions (Signed)
  Take it easy, but do not lay around too much as this may make any stiffness worse.  Antiinflammatory medications: Take 600 mg of ibuprofen every 6 hours or 440 mg (over the counter dose) to 500 mg (prescription dose) of naproxen every 12 hours for the next 3 days. After this time, these medications may be used as needed for pain. Take these medications with food to avoid upset stomach. Choose only one of these medications, do not take them together. Acetaminophen (generic for Tylenol): Should you continue to have additional pain while taking the ibuprofen or naproxen, you may add in acetaminophen as needed. Your daily total maximum amount of acetaminophen from all sources should be limited to 4000mg/day for persons without liver problems, or 2000mg/day for those with liver problems. Methocarbamol: Methocarbamol (generic for Robaxin) is a muscle relaxer and can help relieve stiff muscles or muscle spasms.  Do not drive or perform other dangerous activities while taking this medication as it can cause drowsiness as well as changes in reaction time and judgement. Lidocaine patches: These are available via either prescription or over-the-counter. The over-the-counter option may be more economical one and are likely just as effective. There are multiple over-the-counter brands, such as Salonpas. Ice: May apply ice to the area over the next 24 hours for 15 minutes at a time to reduce pain, inflammation, and swelling, if present. Exercises: Be sure to perform the attached exercises starting with three times a week and working up to performing them daily. This is an essential part of preventing long term problems.  Follow up: Follow up with a primary care provider for any future management of these complaints. Be sure to follow up within 7-10 days. Return: Return to the ED should symptoms worsen.  For prescription assistance, may try using prescription discount sites or apps, such as goodrx.com 

## 2020-02-11 NOTE — ED Notes (Signed)
Pt verbalized understanding d/c instructions. 
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
## 2020-02-11 NOTE — ED Notes (Signed)
Pt sitting in chair on phone.
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
## 2020-02-11 NOTE — ED Notes (Signed)
Pt denies numbness or tingling, reports "feeling stiff"

## 2020-02-14 ENCOUNTER — Ambulatory Visit: Payer: 59 | Admitting: Physical Therapy

## 2020-02-15 ENCOUNTER — Ambulatory Visit: Payer: 59

## 2020-02-15 ENCOUNTER — Other Ambulatory Visit: Payer: Self-pay

## 2020-02-15 DIAGNOSIS — M542 Cervicalgia: Secondary | ICD-10-CM

## 2020-02-15 DIAGNOSIS — M25511 Pain in right shoulder: Secondary | ICD-10-CM

## 2020-02-15 DIAGNOSIS — R293 Abnormal posture: Secondary | ICD-10-CM

## 2020-02-15 DIAGNOSIS — M62838 Other muscle spasm: Secondary | ICD-10-CM
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
## 2020-02-20 ENCOUNTER — Other Ambulatory Visit: Payer: Self-pay

## 2020-02-20 ENCOUNTER — Ambulatory Visit: Payer: 59 | Attending: Family Medicine

## 2020-02-20 DIAGNOSIS — M62838 Other muscle spasm: Secondary | ICD-10-CM | POA: Insufficient documentation

## 2020-02-20 DIAGNOSIS — R293 Abnormal posture: Secondary | ICD-10-CM | POA: Insufficient documentation

## 2020-02-20 DIAGNOSIS — M542 Cervicalgia: Secondary | ICD-10-CM | POA: Insufficient documentation

## 2020-02-20 DIAGNOSIS — M25511 Pain in right shoulder: Secondary | ICD-10-CM | POA: Insufficient documentation
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
## 2020-02-22 ENCOUNTER — Encounter: Payer: Self-pay | Admitting: Family Medicine

## 2020-02-22 ENCOUNTER — Ambulatory Visit (INDEPENDENT_AMBULATORY_CARE_PROVIDER_SITE_OTHER): Payer: 59 | Admitting: Family Medicine

## 2020-02-22 ENCOUNTER — Other Ambulatory Visit: Payer: Self-pay

## 2020-02-22 DIAGNOSIS — M25511 Pain in right shoulder: Secondary | ICD-10-CM | POA: Diagnosis not present

## 2020-02-22 DIAGNOSIS — M25512 Pain in left shoulder: Secondary | ICD-10-CM

## 2020-02-22 DIAGNOSIS — M542 Cervicalgia: Secondary | ICD-10-CM

## 2020-02-22 MED ORDER — BACLOFEN 10 MG PO TABS: 5.0000 mg | ORAL_TABLET | Freq: Three times a day (TID) | ORAL | 3 refills | Status: DC | PRN

## 2020-02-22 MED ORDER — NABUMETONE 750 MG PO TABS: 750.0000 mg | ORAL_TABLET | Freq: Two times a day (BID) | ORAL | 6 refills | Status: DC | PRN
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
## 2020-03-03 ENCOUNTER — Ambulatory Visit (HOSPITAL_BASED_OUTPATIENT_CLINIC_OR_DEPARTMENT_OTHER)
Admission: RE | Admit: 2020-03-03 | Discharge: 2020-03-03 | Disposition: A | Discharge status: Home / Self Care | Payer: 59 | Source: Ambulatory Visit | Attending: Family Medicine | Admitting: Family Medicine

## 2020-03-03 ENCOUNTER — Other Ambulatory Visit: Payer: Self-pay

## 2020-03-03 ENCOUNTER — Emergency Department (HOSPITAL_BASED_OUTPATIENT_CLINIC_OR_DEPARTMENT_OTHER): Admission: EM | Admit: 2020-03-03 | Discharge: 2020-03-03 | Discharge status: Absent without leave | Payer: 59

## 2020-03-03 DIAGNOSIS — M25512 Pain in left shoulder: Secondary | ICD-10-CM | POA: Diagnosis present

## 2020-03-05 ENCOUNTER — Telehealth: Payer: Self-pay | Admitting: Family Medicine

## 2020-03-05 NOTE — Telephone Encounter (Signed)
Patient aware of the results and will call back to schedule if he wants a cortisone injection

## 2020-03-05 NOTE — Telephone Encounter (Signed)
Shoulder MRI scan shows that the rotator cuff looks irritated but not torn.  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  No surgery indicated at this point.  If still having severe pain then we could contemplate cortisone injection.  Otherwise we will continue with physical therapy.

## 2020-03-06 ENCOUNTER — Other Ambulatory Visit: Payer: Self-pay

## 2020-03-06 ENCOUNTER — Ambulatory Visit (INDEPENDENT_AMBULATORY_CARE_PROVIDER_SITE_OTHER): Payer: 59 | Admitting: Family Medicine

## 2020-03-06 ENCOUNTER — Encounter: Payer: Self-pay | Admitting: Family Medicine

## 2020-03-06 ENCOUNTER — Ambulatory Visit: Payer: Self-pay

## 2020-03-06 DIAGNOSIS — M25512 Pain in left shoulder: Secondary | ICD-10-CM
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
## 2020-03-07 ENCOUNTER — Ambulatory Visit: Payer: 59 | Admitting: Physical Therapy

## 2020-03-14 ENCOUNTER — Other Ambulatory Visit: Payer: Self-pay

## 2020-03-14 ENCOUNTER — Encounter: Payer: Self-pay | Admitting: Physical Therapy

## 2020-03-14 ENCOUNTER — Ambulatory Visit: Payer: 59 | Admitting: Physical Therapy

## 2020-03-14 DIAGNOSIS — M542 Cervicalgia: Secondary | ICD-10-CM

## 2020-03-14 DIAGNOSIS — R293 Abnormal posture: Secondary | ICD-10-CM

## 2020-03-14 DIAGNOSIS — M25511 Pain in right shoulder: Secondary | ICD-10-CM

## 2020-03-14 DIAGNOSIS — M62838 Other muscle spasm: Secondary | ICD-10-CM
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
## 2020-03-15 ENCOUNTER — Ambulatory Visit (HOSPITAL_BASED_OUTPATIENT_CLINIC_OR_DEPARTMENT_OTHER)
Admission: RE | Admit: 2020-03-15 | Discharge: 2020-03-15 | Disposition: A | Discharge status: Home / Self Care | Payer: No Typology Code available for payment source | Source: Ambulatory Visit | Attending: Family Medicine | Admitting: Family Medicine

## 2020-03-15 DIAGNOSIS — M25512 Pain in left shoulder: Secondary | ICD-10-CM | POA: Insufficient documentation

## 2020-03-20 ENCOUNTER — Ambulatory Visit: Payer: 59 | Admitting: Physical Therapy

## 2020-03-21 ENCOUNTER — Ambulatory Visit: Payer: 59 | Admitting: Family Medicine

## 2020-03-26 ENCOUNTER — Ambulatory Visit: Payer: 59 | Admitting: Physical Therapy

## 2020-03-27 ENCOUNTER — Other Ambulatory Visit: Payer: Self-pay

## 2020-03-27 ENCOUNTER — Ambulatory Visit: Payer: Self-pay

## 2020-03-27 ENCOUNTER — Encounter: Payer: Self-pay | Admitting: Family Medicine

## 2020-03-27 ENCOUNTER — Ambulatory Visit (INDEPENDENT_AMBULATORY_CARE_PROVIDER_SITE_OTHER): Payer: 59 | Admitting: Family Medicine

## 2020-03-27 DIAGNOSIS — M25512 Pain in left shoulder: Secondary | ICD-10-CM
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
## 2020-03-30 ENCOUNTER — Other Ambulatory Visit: Payer: Self-pay

## 2020-04-02 ENCOUNTER — Ambulatory Visit (INDEPENDENT_AMBULATORY_CARE_PROVIDER_SITE_OTHER): Payer: 59 | Admitting: Medical

## 2020-04-02 ENCOUNTER — Other Ambulatory Visit: Payer: Self-pay

## 2020-04-02 ENCOUNTER — Encounter: Payer: Self-pay | Admitting: Medical

## 2020-04-02 VITALS — BP 110/70 | HR 72 | Temp 98.4°F | Resp 18 | Ht 73.0 in | Wt 150.8 lb

## 2020-04-02 DIAGNOSIS — R350 Frequency of micturition: Secondary | ICD-10-CM | POA: Diagnosis not present

## 2020-04-02 DIAGNOSIS — Z1211 Encounter for screening for malignant neoplasm of colon: Secondary | ICD-10-CM

## 2020-04-02 DIAGNOSIS — N400 Enlarged prostate without lower urinary tract symptoms: Secondary | ICD-10-CM

## 2020-04-02 DIAGNOSIS — Z0000 Encounter for general adult medical examination without abnormal findings: Secondary | ICD-10-CM

## 2020-04-02 DIAGNOSIS — I2510 Atherosclerotic heart disease of native coronary artery without angina pectoris: Secondary | ICD-10-CM

## 2020-04-02 DIAGNOSIS — E785 Hyperlipidemia, unspecified: Secondary | ICD-10-CM | POA: Diagnosis not present

## 2020-04-02 DIAGNOSIS — Z113 Encounter for screening for infections with a predominantly sexual mode of transmission: Secondary | ICD-10-CM

## 2020-04-02 DIAGNOSIS — Z125 Encounter for screening for malignant neoplasm of prostate: Secondary | ICD-10-CM

## 2020-04-02 DIAGNOSIS — I8393 Asymptomatic varicose veins of bilateral lower extremities: Secondary | ICD-10-CM

## 2020-04-02 LAB — COMPREHENSIVE METABOLIC PANEL
ALT: 14 U/L (ref 0–53)
AST: 14 U/L (ref 0–37)
Albumin: 4.5 g/dL (ref 3.5–5.2)
Alkaline Phosphatase: 61 U/L (ref 39–117)
BUN: 14 mg/dL (ref 6–23)
CO2: 32 mEq/L (ref 19–32)
Calcium: 10 mg/dL (ref 8.4–10.5)
Chloride: 101 mEq/L (ref 96–112)
Creatinine, Ser: 1.08 mg/dL (ref 0.40–1.50)
GFR: 84.29 mL/min (ref >60.00)
Glucose, Bld: 83 mg/dL (ref 70–99)
Potassium: 4.5 mEq/L (ref 3.5–5.1)
Sodium: 139 mEq/L (ref 135–145)
Total Bilirubin: 0.4 mg/dL (ref 0.2–1.2)
Total Protein: 6.8 g/dL (ref 6.0–8.3)

## 2020-04-02 LAB — POC URINALSYSI DIPSTICK (AUTOMATED)
Bilirubin, UA: NEGATIVE
Blood, UA: NEGATIVE
Glucose, UA: NEGATIVE
Ketones, UA: NEGATIVE
Leukocytes, UA: NEGATIVE
Nitrite, UA: NEGATIVE
Protein, UA: NEGATIVE
Spec Grav, UA: 1.025 (ref 1.010–1.025)
Urobilinogen, UA: 0.2 E.U./dL
pH, UA: 5.5 (ref 5.0–8.0)

## 2020-04-02 LAB — LIPID PANEL
Cholesterol: 189 mg/dL (ref 0–200)
HDL: 67.5 mg/dL (ref >39.00)
LDL Cholesterol: 112 mg/dL — ABNORMAL HIGH (ref 0–99)
NonHDL: 121.99
Total CHOL/HDL Ratio: 3
Triglycerides: 52 mg/dL (ref 0.0–149.0)
VLDL: 10.4 mg/dL (ref 0.0–40.0)

## 2020-04-02 LAB — CBC WITH DIFFERENTIAL/PLATELET
Basophils Absolute: 0 10*3/uL (ref 0.0–0.1)
Basophils Relative: 0.4 % (ref 0.0–3.0)
Eosinophils Absolute: 0.1 10*3/uL (ref 0.0–0.7)
Eosinophils Relative: 0.9 % (ref 0.0–5.0)
HCT: 49.4 % (ref 39.0–52.0)
Hemoglobin: 16.1 g/dL (ref 13.0–17.0)
Lymphocytes Relative: 24.9 % (ref 12.0–46.0)
Lymphs Abs: 1.5 10*3/uL (ref 0.7–4.0)
MCHC: 32.6 g/dL (ref 30.0–36.0)
MCV: 94.4 fl (ref 78.0–100.0)
Monocytes Absolute: 0.4 10*3/uL (ref 0.1–1.0)
Monocytes Relative: 7.7 % (ref 3.0–12.0)
Neutro Abs: 3.9 10*3/uL (ref 1.4–7.7)
Neutrophils Relative %: 66.1 % (ref 43.0–77.0)
Platelets: 216 10*3/uL (ref 150.0–400.0)
RBC: 5.23 Mil/uL (ref 4.22–5.81)
RDW: 14.4 % (ref 11.5–15.5)
WBC: 5.9 10*3/uL (ref 4.0–10.5)

## 2020-04-02 LAB — PSA: PSA: 1.65 ng/mL (ref 0.10–4.00)
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
## 2020-04-02 NOTE — Patient Instructions (Addendum)
For you wellness exam today I have ordered cbc, cmp, lipid panel, ua and hiv.  For hx of bph and prostatitis, will get psa, UA and plan to refer you back to urologist.  Vaccine tdap declined. He is thinking about getting covid vaccine. Not decided yet.  Recommend exercise and healthy diet.  We will let you know lab results as they come in.  Follow up date appointment will be determined after lab review.   For high cholesterol will follow lipid panel. Do recommend that you stop smoking.   With recent stress can give you medication if anxiety worsens. But try to revet to smoking.  Refer to screening colonoscopy.  Follow up date to be determined after lab review.   Preventive Care 75-54 Years Old, Male Preventive care refers to lifestyle choices and visits with your health care provider that can promote health and wellness. This includes:  A yearly physical exam. This is also called an annual well check.  Regular dental and eye exams.  Immunizations.  Screening for certain conditions.  Healthy lifestyle choices, such as eating a healthy diet, getting regular exercise, not using drugs or products that contain nicotine and tobacco, and limiting alcohol use. What can I expect for my preventive care visit? Physical exam Your health care provider will check:  Height and weight. These may be used to calculate body mass index (BMI), which is a measurement that tells if you are at a healthy weight.  Heart rate and blood pressure.  Your skin for abnormal spots. Counseling Your health care provider may ask you questions about:  Alcohol, tobacco, and drug use.  Emotional well-being.  Home and relationship well-being.  Sexual activity.  Eating habits.  Work and work Statistician. What immunizations do I need?  Influenza (flu) vaccine  This is recommended every year. Tetanus, diphtheria, and pertussis (Tdap) vaccine  You may need a Td booster every 10 years. Varicella  (chickenpox) vaccine  You may need this vaccine if you have not already been vaccinated. Zoster (shingles) vaccine  You may need this after age 12. Measles, mumps, and rubella (MMR) vaccine  You may need at least one dose of MMR if you were born in 1957 or later. You may also need a second dose. Pneumococcal conjugate (PCV13) vaccine  You may need this if you have certain conditions and were not previously vaccinated. Pneumococcal polysaccharide (PPSV23) vaccine  You may need one or two doses if you smoke cigarettes or if you have certain conditions. Meningococcal conjugate (MenACWY) vaccine  You may need this if you have certain conditions. Hepatitis A vaccine  You may need this if you have certain conditions or if you travel or work in places where you may be exposed to hepatitis A. Hepatitis B vaccine  You may need this if you have certain conditions or if you travel or work in places where you may be exposed to hepatitis B. Haemophilus influenzae type b (Hib) vaccine  You may need this if you have certain risk factors. Human papillomavirus (HPV) vaccine  If recommended by your health care provider, you may need three doses over 6 months. You may receive vaccines as individual doses or as more than one vaccine together in one shot (combination vaccines). Talk with your health care provider about the risks and benefits of combination vaccines. What tests do I need? Blood tests  Lipid and cholesterol levels. These may be checked every 5 years, or more frequently if you are over 64 years old.  Hepatitis C test.  Hepatitis B test. Screening  Lung cancer screening. You may have this screening every year starting at age 28 if you have a 30-pack-year history of smoking and currently smoke or have quit within the past 15 years.  Prostate cancer screening. Recommendations will vary depending on your family history and other risks.  Colorectal cancer screening. All adults should  have this screening starting at age 22 and continuing until age 52. Your health care provider may recommend screening at age 44 if you are at increased risk. You will have tests every 1-10 years, depending on your results and the type of screening test.  Diabetes screening. This is done by checking your blood sugar (glucose) after you have not eaten for a while (fasting). You may have this done every 1-3 years.  Sexually transmitted disease (STD) testing. Follow these instructions at home: Eating and drinking  Eat a diet that includes fresh fruits and vegetables, whole grains, lean protein, and low-fat dairy products.  Take vitamin and mineral supplements as recommended by your health care provider.  Do not drink alcohol if your health care provider tells you not to drink.  If you drink alcohol: ? Limit how much you have to 0-2 drinks a day. ? Be aware of how much alcohol is in your drink. In the [place] one drink equals one 12 oz bottle of beer (355 mL), one 5 oz glass of wine (148 mL), or one 1 oz glass of hard liquor (44 mL). Lifestyle  Take daily care of your teeth and gums.  Stay active. Exercise for at least 30 minutes on 5 or more days each week.  Do not use any products that contain nicotine or tobacco, such as cigarettes, e-cigarettes, and chewing tobacco. If you need help quitting, ask your health care provider.  If you are sexually active, practice safe sex. Use a condom or other form of protection to prevent STIs (sexually transmitted infections).  Talk with your health care provider about taking a low-dose aspirin every day starting at age 82. What's next?  Go to your health care provider once a year for a well check visit.  Ask your health care provider how often you should have your eyes and teeth checked.  Stay up to date on all vaccines. This information is not intended to replace advice given to you by your health care provider. Make sure you discuss any questions  you have with your health care provider. Document Revised: 12/02/2018 Document Reviewed: 12/02/2018 Elsevier Patient Education  2020 [employer].

## 2020-04-03 ENCOUNTER — Encounter: Payer: Self-pay | Admitting: Physical Therapy

## 2020-04-03 ENCOUNTER — Ambulatory Visit: Payer: 59 | Attending: Family Medicine | Admitting: Physical Therapy

## 2020-04-03 ENCOUNTER — Encounter: Payer: Self-pay | Admitting: Gastroenterology

## 2020-04-03 DIAGNOSIS — M25511 Pain in right shoulder: Secondary | ICD-10-CM | POA: Diagnosis present

## 2020-04-03 DIAGNOSIS — G8929 Other chronic pain: Secondary | ICD-10-CM | POA: Insufficient documentation

## 2020-04-03 DIAGNOSIS — R293 Abnormal posture: Secondary | ICD-10-CM | POA: Diagnosis present

## 2020-04-03 DIAGNOSIS — M25512 Pain in left shoulder: Secondary | ICD-10-CM | POA: Insufficient documentation

## 2020-04-03 LAB — HIV ANTIBODY (ROUTINE TESTING W REFLEX): HIV 1&2 Ab, 4th Generation: NONREACTIVE
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
## 2020-04-05 ENCOUNTER — Telehealth: Payer: Self-pay | Admitting: Medical

## 2020-04-05 ENCOUNTER — Other Ambulatory Visit: Payer: Self-pay

## 2020-04-05 ENCOUNTER — Ambulatory Visit: Payer: 59 | Admitting: Physical Therapy

## 2020-04-05 ENCOUNTER — Encounter: Payer: Self-pay | Admitting: Physical Therapy

## 2020-04-05 DIAGNOSIS — M25512 Pain in left shoulder: Secondary | ICD-10-CM

## 2020-04-05 DIAGNOSIS — R293 Abnormal posture: Secondary | ICD-10-CM

## 2020-04-05 DIAGNOSIS — G8929 Other chronic pain: Secondary | ICD-10-CM

## 2020-04-05 MED ORDER — TAMSULOSIN HCL 0.4 MG PO CAPS: 0.4000 mg | ORAL_CAPSULE | Freq: Every day | ORAL | 3 refills | Status: DC

## 2020-04-05 MED ORDER — ROSUVASTATIN CALCIUM 5 MG PO TABS: 5.0000 mg | ORAL_TABLET | Freq: Every day | ORAL | 3 refills | Status: DC

## 2020-04-05 NOTE — Patient Instructions (Signed)
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
## 2020-04-05 NOTE — Telephone Encounter (Signed)
Rx crestor and flomax sent to pt pharmacy.

## 2020-04-09 ENCOUNTER — Other Ambulatory Visit: Payer: Self-pay

## 2020-04-09 ENCOUNTER — Ambulatory Visit: Payer: 59

## 2020-04-09 DIAGNOSIS — R293 Abnormal posture: Secondary | ICD-10-CM

## 2020-04-09 DIAGNOSIS — M25512 Pain in left shoulder: Secondary | ICD-10-CM | POA: Diagnosis not present

## 2020-04-09 DIAGNOSIS — G8929 Other chronic pain: Secondary | ICD-10-CM
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
## 2020-04-10 ENCOUNTER — Telehealth: Payer: Self-pay | Admitting: Medical

## 2020-04-10 NOTE — Telephone Encounter (Signed)
Patient called to schedule with Dr. Servando Salina - advised we would need a referral. He will contact PCP to have referral sent over.

## 2020-04-12 ENCOUNTER — Other Ambulatory Visit: Payer: Self-pay

## 2020-04-12 ENCOUNTER — Ambulatory Visit: Payer: 59

## 2020-04-12 DIAGNOSIS — R293 Abnormal posture: Secondary | ICD-10-CM

## 2020-04-12 DIAGNOSIS — G8929 Other chronic pain: Secondary | ICD-10-CM

## 2020-04-12 DIAGNOSIS — M25512 Pain in left shoulder: Secondary | ICD-10-CM

## 2020-04-12 DIAGNOSIS — M25511 Pain in right shoulder: Secondary | ICD-10-CM
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
## 2020-04-12 NOTE — Patient Instructions (Signed)

## 2020-04-16 ENCOUNTER — Ambulatory Visit: Payer: 59

## 2020-04-16 ENCOUNTER — Other Ambulatory Visit: Payer: Self-pay

## 2020-04-16 DIAGNOSIS — R293 Abnormal posture: Secondary | ICD-10-CM

## 2020-04-16 DIAGNOSIS — G8929 Other chronic pain: Secondary | ICD-10-CM

## 2020-04-16 DIAGNOSIS — M25512 Pain in left shoulder: Secondary | ICD-10-CM | POA: Diagnosis not present
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
## 2020-04-17 ENCOUNTER — Telehealth: Payer: Self-pay

## 2020-04-19 ENCOUNTER — Ambulatory Visit (AMBULATORY_SURGERY_CENTER): Payer: Self-pay | Admitting: *Deleted

## 2020-04-19 ENCOUNTER — Telehealth: Payer: Self-pay | Admitting: *Deleted

## 2020-04-19 ENCOUNTER — Other Ambulatory Visit: Payer: Self-pay

## 2020-04-19 VITALS — Temp 97.7°F | Ht 73.0 in | Wt 148.0 lb

## 2020-04-19 DIAGNOSIS — Z01818 Encounter for other preprocedural examination: Secondary | ICD-10-CM

## 2020-04-19 DIAGNOSIS — Z1211 Encounter for screening for malignant neoplasm of colon: Secondary | ICD-10-CM

## 2020-04-19 MED ORDER — SUPREP BOWEL PREP KIT 17.5-3.13-1.6 GM/177ML PO SOLN: 1.0000 | Freq: Once | ORAL | 0 refills | Status: AC

## 2020-04-19 NOTE — Progress Notes (Signed)

## 2020-04-19 NOTE — Telephone Encounter (Signed)
Dr Barron Alvine,  I just saw this patient in PV-  He is a screening colon and is scheduled for 04-30-2020 Monday. During PV , he told me that in 1999 he had a cardiac cath with a stent placement - , he has had varicose vein thrombosis surgery in 2014/2015- he is a cyclist, very active, denies SOB or chest pain, no radiating pain however, he states he is having some same s/s as before when he had his stent placed- sharp pain in the chest at times, not consistent   -  He saw his PCP 03-2020 and was referred to a cardiovascular surgeon but has no appointment to see her yetPollie Friar) -  [place]   Does he need an OV with you prior to a colon or can we proceed as scheduled?  Please advise- Thanks, Elizebeth Brooking

## 2020-04-19 NOTE — Telephone Encounter (Signed)
Yes, with that medical history and sxs that he feels could be cardiac in nature, more prudent to delay this screening colo in favor or OV with me and appts with Cards. Thanks.

## 2020-04-19 NOTE — Telephone Encounter (Signed)
Tried pt- sch OC with Dr Barron Alvine- rang many times- no answer- no VM- will try pt Friday  Hilda Lias

## 2020-04-20 ENCOUNTER — Ambulatory Visit: Payer: 59

## 2020-04-20 NOTE — Telephone Encounter (Signed)
Called pt's number- no answer , no VM--  [facility] to [facility] ov with Dr Barron Alvine

## 2020-04-23 ENCOUNTER — Ambulatory Visit: Payer: 59 | Attending: Family Medicine

## 2020-04-24 NOTE — Telephone Encounter (Signed)
Attempted to reach pt- no answer and no voicemail set up.  LMOM on wife's machine to have pt call our office regarding appt.

## 2020-04-24 NOTE — Telephone Encounter (Signed)
Pt returning your call

## 2020-04-24 NOTE — Telephone Encounter (Signed)
Spoke with pt and explained that Dr. Barron Alvine would like an OV prior to colonoscopy.  Colonoscopy is cancelled for now and understanding voiced.  OV made for 06-13-20 at 900.  Letter mailed to pt with this date, time and location per his request

## 2020-04-25 ENCOUNTER — Other Ambulatory Visit: Payer: Self-pay

## 2020-04-25 ENCOUNTER — Emergency Department (HOSPITAL_COMMUNITY): Payer: 59

## 2020-04-25 ENCOUNTER — Encounter (HOSPITAL_COMMUNITY): Payer: Self-pay | Admitting: Internal Medicine

## 2020-04-25 ENCOUNTER — Observation Stay (HOSPITAL_COMMUNITY)
Admission: EM | Admit: 2020-04-25 | Discharge: 2020-04-26 | Disposition: A | Discharge status: Home / Self Care | Payer: 59 | Attending: Internal Medicine | Admitting: Internal Medicine

## 2020-04-25 DIAGNOSIS — N400 Enlarged prostate without lower urinary tract symptoms: Secondary | ICD-10-CM | POA: Diagnosis not present

## 2020-04-25 DIAGNOSIS — Z20822 Contact with and (suspected) exposure to covid-19: Secondary | ICD-10-CM | POA: Diagnosis not present

## 2020-04-25 DIAGNOSIS — I10 Essential (primary) hypertension: Secondary | ICD-10-CM | POA: Insufficient documentation

## 2020-04-25 DIAGNOSIS — F17210 Nicotine dependence, cigarettes, uncomplicated: Secondary | ICD-10-CM | POA: Insufficient documentation

## 2020-04-25 DIAGNOSIS — R079 Chest pain, unspecified: Secondary | ICD-10-CM

## 2020-04-25 DIAGNOSIS — R0789 Other chest pain: Secondary | ICD-10-CM | POA: Diagnosis not present

## 2020-04-25 DIAGNOSIS — E785 Hyperlipidemia, unspecified: Secondary | ICD-10-CM | POA: Insufficient documentation

## 2020-04-25 DIAGNOSIS — K219 Gastro-esophageal reflux disease without esophagitis: Secondary | ICD-10-CM | POA: Diagnosis not present

## 2020-04-25 DIAGNOSIS — Z7982 Long term (current) use of aspirin: Secondary | ICD-10-CM | POA: Diagnosis not present

## 2020-04-25 DIAGNOSIS — Z79899 Other long term (current) drug therapy: Secondary | ICD-10-CM | POA: Diagnosis not present

## 2020-04-25 DIAGNOSIS — Z955 Presence of coronary angioplasty implant and graft: Secondary | ICD-10-CM | POA: Insufficient documentation

## 2020-04-25 DIAGNOSIS — I2510 Atherosclerotic heart disease of native coronary artery without angina pectoris: Secondary | ICD-10-CM | POA: Diagnosis not present

## 2020-04-25 DIAGNOSIS — M1990 Unspecified osteoarthritis, unspecified site: Secondary | ICD-10-CM | POA: Diagnosis not present

## 2020-04-25 DIAGNOSIS — Z791 Long term (current) use of non-steroidal anti-inflammatories (NSAID): Secondary | ICD-10-CM | POA: Insufficient documentation

## 2020-04-25 HISTORY — DX: Chest pain, unspecified: R07.9

## 2020-04-25 LAB — BASIC METABOLIC PANEL
Anion gap: 10 (ref 5–15)
BUN: 15 mg/dL (ref 6–20)
CO2: 27 mmol/L (ref 22–32)
Calcium: 9.2 mg/dL (ref 8.9–10.3)
Chloride: 102 mmol/L (ref 98–111)
Creatinine, Ser: 1.09 mg/dL (ref 0.61–1.24)
GFR calc Af Amer: >60 mL/min (ref >60)
GFR calc non Af Amer: >60 mL/min (ref >60)
Glucose, Bld: 61 mg/dL — ABNORMAL LOW (ref 70–99)
Potassium: 4.1 mmol/L (ref 3.5–5.1)
Sodium: 139 mmol/L (ref 135–145)

## 2020-04-25 LAB — RESPIRATORY PANEL BY RT PCR (FLU A&B, COVID)
Influenza A by PCR: NEGATIVE
Influenza B by PCR: NEGATIVE
SARS Coronavirus 2 by RT PCR: NEGATIVE

## 2020-04-25 LAB — LIPID PANEL
Cholesterol: 184 mg/dL (ref 0–200)
HDL: 63 mg/dL (ref >40)
LDL Cholesterol: 113 mg/dL — ABNORMAL HIGH (ref 0–99)
Total CHOL/HDL Ratio: 2.9 RATIO
Triglycerides: 40 mg/dL (ref <150)
VLDL: 8 mg/dL (ref 0–40)

## 2020-04-25 LAB — CBC
HCT: 45 % (ref 39.0–52.0)
Hemoglobin: 14.7 g/dL (ref 13.0–17.0)
MCH: 30.8 pg (ref 26.0–34.0)
MCHC: 32.7 g/dL (ref 30.0–36.0)
MCV: 94.1 fL (ref 80.0–100.0)
Platelets: 231 10*3/uL (ref 150–400)
RBC: 4.78 MIL/uL (ref 4.22–5.81)
RDW: 14.2 % (ref 11.5–15.5)
WBC: 6.7 10*3/uL (ref 4.0–10.5)
nRBC: 0 % (ref 0.0–0.2)

## 2020-04-25 LAB — TROPONIN I (HIGH SENSITIVITY)
Troponin I (High Sensitivity): 3 ng/L (ref <18)
Troponin I (High Sensitivity): <2 ng/L (ref <18)

## 2020-04-25 LAB — CBG MONITORING, ED
Glucose-Capillary: 49 mg/dL — ABNORMAL LOW (ref 70–99)
Glucose-Capillary: 96 mg/dL (ref 70–99)

## 2020-04-25 LAB — SEDIMENTATION RATE: Sed Rate: 15 mm/hr (ref 0–16)

## 2020-04-25 MED ORDER — AMLODIPINE BESYLATE 5 MG PO TABS
5.0000 mg | ORAL_TABLET | Freq: Every day | ORAL | Status: DC
  Administered 2020-04-25 – 2020-04-26 (×2): 5 mg via ORAL
  Filled 2020-04-25 (×2): qty 1

## 2020-04-25 MED ORDER — TAMSULOSIN HCL 0.4 MG PO CAPS
0.4000 mg | ORAL_CAPSULE | Freq: Every day | ORAL | Status: DC
  Administered 2020-04-25 – 2020-04-26 (×2): 0.4 mg via ORAL
  Filled 2020-04-25 (×2): qty 1

## 2020-04-25 MED ORDER — ASPIRIN 81 MG PO CHEW
81.0000 mg | CHEWABLE_TABLET | Freq: Every day | ORAL | Status: DC
  Administered 2020-04-26: 81 mg via ORAL
  Filled 2020-04-25: qty 1

## 2020-04-25 MED ORDER — SODIUM CHLORIDE 0.9% FLUSH
3.0000 mL | Freq: Once | INTRAVENOUS | Status: AC
  Administered 2020-04-25: 3 mL via INTRAVENOUS

## 2020-04-25 MED ORDER — CYCLOBENZAPRINE HCL 10 MG PO TABS: 10.0000 mg | ORAL_TABLET | Freq: Three times a day (TID) | ORAL | Status: DC | PRN

## 2020-04-25 MED ORDER — MELOXICAM 7.5 MG PO TABS
7.5000 mg | ORAL_TABLET | Freq: Every day | ORAL | Status: DC
  Administered 2020-04-25 – 2020-04-26 (×2): 7.5 mg via ORAL
  Filled 2020-04-25 (×2): qty 1

## 2020-04-25 MED ORDER — ACETAMINOPHEN 650 MG RE SUPP: 650.0000 mg | Freq: Four times a day (QID) | RECTAL | Status: DC | PRN

## 2020-04-25 MED ORDER — NITROGLYCERIN 0.4 MG SL SUBL: 0.4000 mg | SUBLINGUAL_TABLET | SUBLINGUAL | Status: DC | PRN

## 2020-04-25 MED ORDER — ACETAMINOPHEN 325 MG PO TABS: 650.0000 mg | ORAL_TABLET | Freq: Four times a day (QID) | ORAL | Status: DC | PRN

## 2020-04-25 MED ORDER — ROSUVASTATIN CALCIUM 5 MG PO TABS
5.0000 mg | ORAL_TABLET | Freq: Every day | ORAL | Status: DC
  Administered 2020-04-25 – 2020-04-26 (×2): 5 mg via ORAL
  Filled 2020-04-25 (×2): qty 1

## 2020-04-25 MED ORDER — METHOCARBAMOL 500 MG PO TABS
500.0000 mg | ORAL_TABLET | Freq: Two times a day (BID) | ORAL | Status: DC
  Administered 2020-04-26: 500 mg via ORAL
  Filled 2020-04-25: qty 1

## 2020-04-25 MED ORDER — BACLOFEN 5 MG HALF TABLET: 5.0000 mg | ORAL_TABLET | Freq: Three times a day (TID) | ORAL | Status: DC | PRN

## 2020-04-25 NOTE — ED Notes (Signed)
No chest pain eating a big chicken meal

## 2020-04-25 NOTE — ED Notes (Signed)
MD Freida Busman notified of pt CBG. Pt provided with orange juice.

## 2020-04-25 NOTE — ED Triage Notes (Signed)
Pt arrives via ems with reports of intermittent chest pain worse when lying down. Pain in central, non radiating. Given 324mg  aspirin. 18g LFA.

## 2020-04-25 NOTE — ED Provider Notes (Signed)
[place] EMERGENCY DEPARTMENT Provider Note   CSN: [id number] Arrival date & time: 04/25/20  1305     History Chief Complaint  Patient presents with  . Chest Pain    Matthew Gaines is a 59 y.o. male history CAD, stent, GERD, hypertension, hyperlipidemia, smoker.  Patient reports intermittent chest pain, 4 episodes over the last 3 weeks.  Primarily occurs when lying down in the morning, gradually improves after sitting up and throughout the day.  Describes a sharp stabbing sensation left side of his chest nonradiating no clear aggravating or alleviating factors, associated with diaphoresis.  Last episode of pain began around 9 AM this morning and has since resolved.  Patient received full dose aspirin prior to arrival.  Of note patient was hypoglycemic in the waiting room, improved with orange juice.  He reports that he has not eaten today.  Denies fever/chills, headache, neck pain, cough/hemoptysis, shortness of breath, abdominal pain, nausea/vomiting, diarrhea, numbness/weakness, tingling, swelling/color change of the extremities or any additional concerns.  HPI     Past Medical History:  Diagnosis Date  . Allergy    Q 2-3 yrs has issues   . Arthritis   . GERD (gastroesophageal reflux disease)    past hx   . Hyperlipidemia   . Overactive bladder   . Poor circulation     There are no problems to display for this patient.   Past Surgical History:  Procedure Laterality Date  . CARDIAC CATHETERIZATION  1999  . CIRCUMCISION    . CORONARY ANGIOPLASTY WITH STENT PLACEMENT  1999  . LASER ABLATION OF VASCULAR LESION    . VASCULAR SURGERY         Family History  Problem Relation Age of Onset  . Lung cancer Mother   . Colon polyps Brother   . Colon cancer Neg Hx   . Esophageal cancer Neg Hx   . Rectal cancer Neg Hx   . Stomach cancer Neg Hx     Social History   Tobacco Use  . Smoking status: Current Some Day Smoker    Types: Cigarettes  .  Smokeless tobacco: Never Used  . Tobacco comment: 2-3 a day - some days none   Substance Use Topics  . Alcohol use: Yes    Comment: occ   . Drug use: Never    Home Medications Prior to Admission medications   Medication Sig Start Date End Date Taking? Authorizing Provider  aspirin 81 MG chewable tablet Chew by mouth daily.    [provider]  baclofen (LIORESAL) 10 MG tablet Take 0.5-1 tablets (5-10 mg total) by mouth 3 (three) times daily as needed for muscle spasms. Patient not taking: Reported on 03/27/2020 02/22/20   Hilts, Legrand Como, MD  cyclobenzaprine (FLEXERIL) 10 MG tablet Take 10 mg by mouth 3 (three) times daily as needed for muscle spasms.    [provider]  lidocaine (LIDODERM) 5 % Place 1 patch onto the skin daily. Remove & Discard patch within 12 hours or as directed by MD 02/11/20   Arlean Hopping C, PA-C  meloxicam (MOBIC) 7.5 MG tablet Take 1 tablet (7.5 mg total) by mouth daily. 11/18/19   Scot Jun, FNP  methocarbamol (ROBAXIN) 500 MG tablet Take 1 tablet (500 mg total) by mouth 2 (two) times daily. 02/11/20   Joy, Shawn C, PA-C  nabumetone (RELAFEN) 750 MG tablet Take 1 tablet (750 mg total) by mouth 2 (two) times daily as needed. Patient not taking: Reported on  03/27/2020 02/22/20   Hilts, Casimiro Needle, MD  rosuvastatin (CRESTOR) 5 MG tablet Take 1 tablet (5 mg total) by mouth daily. Patient not taking: Reported on 04/19/2020 04/05/20   Saguier, Ramon Dredge, PA-C  tamsulosin (FLOMAX) 0.4 MG CAPS capsule Take 1 capsule (0.4 mg total) by mouth daily. Patient not taking: Reported on 04/19/2020 04/05/20   Saguier, Ramon Dredge, PA-C    Allergies    Patient has no known allergies.  Review of Systems   Review of Systems Ten systems are reviewed and are negative for acute change except as noted in the HPI  Physical Exam Updated Vital Signs BP 118/83 (BP Location: Left Arm)   Pulse 61   Temp 98.4 F (36.9 C) (Oral)   Resp 14   SpO2 100%   Physical Exam Constitutional:       General: He is not in acute distress.    Appearance: Normal appearance. He is well-developed. He is not ill-appearing or diaphoretic.  HENT:     Head: Normocephalic and atraumatic.     Right Ear: External ear normal.     Left Ear: External ear normal.     Nose: Nose normal.  Eyes:     General: Vision grossly intact. Gaze aligned appropriately.     Pupils: Pupils are equal, round, and reactive to light.  Neck:     Trachea: Trachea and phonation normal. No tracheal deviation.  Cardiovascular:     Rate and Rhythm: Normal rate and regular rhythm.     Pulses:          Dorsalis pedis pulses are 2+ on the right side and 2+ on the left side.     Heart sounds: Normal heart sounds.  Pulmonary:     Effort: Pulmonary effort is normal. No respiratory distress.     Breath sounds: Normal breath sounds.  Abdominal:     General: There is no distension.     Palpations: Abdomen is soft. There is no pulsatile mass.     Tenderness: There is no abdominal tenderness. There is no guarding or rebound.  Musculoskeletal:        General: Normal range of motion.     Cervical back: Normal range of motion.     Right lower leg: No tenderness. No edema.     Left lower leg: No tenderness. No edema.  Skin:    General: Skin is warm and dry.  Neurological:     Mental Status: He is alert.     GCS: GCS eye subscore is 4. GCS verbal subscore is 5. GCS motor subscore is 6.     Comments: Speech is clear and goal oriented, follows commands Major Cranial nerves without deficit, no facial droop Moves extremities without ataxia, coordination intact  Psychiatric:        Behavior: Behavior normal.     ED Results / Procedures / Treatments   Labs (all labs ordered are listed, but only abnormal results are displayed) Labs Reviewed  BASIC METABOLIC PANEL - Abnormal; Notable for the following components:      Result Value   Glucose, Bld 61 (*)    All other components within normal limits  CBG MONITORING, ED -  Abnormal; Notable for the following components:   Glucose-Capillary 49 (*)    All other components within normal limits  RESPIRATORY PANEL BY RT PCR (FLU A&B, COVID)  CBC  CBG MONITORING, ED  TROPONIN I (HIGH SENSITIVITY)  TROPONIN I (HIGH SENSITIVITY)    EKG EKG Interpretation  Date/Time:  Wednesday Apr 25 2020 13:04:27 EDT Ventricular Rate:  66 PR Interval:  154 QRS Duration: 94 QT Interval:  394 QTC Calculation: 413 R Axis:   82 Text Interpretation: Sinus rhythm with Premature atrial complexes Early Repolarization Minimal voltage criteria for LVH, may be normal variant Borderline ECG Confirmed by Geoffery Lyons ([id number]) on 04/25/2020 5:38:45 PM   Radiology DG Chest 2 View  Result Date: 04/25/2020 CLINICAL DATA:  Chest pain for 1 day.  History of smoking. EXAM: CHEST - 2 VIEW COMPARISON:  None. FINDINGS: The heart size and mediastinal contours are within normal limits. Both lungs are clear. The visualized skeletal structures are unremarkable. IMPRESSION: No active cardiopulmonary disease. Electronically Signed   By: Norva Pavlov M.D.   On: 04/25/2020 13:43    Procedures Procedures (including critical care time)  Medications Ordered in ED Medications  sodium chloride flush (NS) 0.9 % injection 3 mL (has no administration in time range)    ED Course  I have reviewed the triage vital signs and the nursing notes.  Pertinent labs & imaging results that were available during my care of the patient were reviewed by me and considered in my medical decision making (see chart for details).  Clinical Course as of Apr 25 1817  Wed Apr 25, 2020  1759 Dr. Chipper Herb   [BM]    Clinical Course User Index [BM] Elizabeth Palau   MDM Rules/Calculators/A&P                     I reviewed and interpreted the following labs.  CBC shows no leukocytosis or evidence of anemia.  High-sensitivity troponin within normal limits.  CBG 96.  BMP shows prior hypoglycemia which is now improved  otherwise within normal limits no emergent electrolyte derangement or evidence of kidney injury.  Chest x-ray:  IMPRESSION:  No active cardiopulmonary disease.  I personally reviewed patient's chest x-ray and agree with radiologist interpretation.  EKG: Sinus rhythm with Premature atrial complexes Early Repolarization Minimal voltage criteria for LVH, may be normal variant Borderline ECG Confirmed by Geoffery Lyons ([id number]) on 04/25/2020 5:38:45 PM - Initially concerned patient's pain may be GI in nature as it is positional worse with lying down in the mornings however he declines this stating that this does not feel like previous.  Pain.  He reports that in the 1990s he had cardiac catheterization and a stent placed and his pain today feels very similar to that time.  He reports that he has not followed up well with cardiology, he is in the process of getting a cardiology referral from his primary care provider.  Patient is currently chest pain-free and had full dose aspirin prior to arrival.  Based on patient's risk factors and EKG heart score is elevated at 5 today, will discuss with hospitalist for admission.  Plan of care discussed with attending physician Dr. Judd Lien who agrees with plan.  Additionally patient's vital signs are stable in the ED, doubt PE, dissection, symptomatic anemia, pneumonia or other emergent cardiopulmonary etiologies at this time.  Screening Covid test ordered.  Patient and family are very hesitant about returning home today they are agreeable for admission.   = 5:59 PM: Discussed case with Dr. Chipper Herb who has accepted patient to hospitalist service.   Note: Portions of this report may have been transcribed using voice recognition software. Every effort was made to ensure accuracy; however, inadvertent computerized transcription errors may still be present. Final Clinical Impression(s) / ED Diagnoses Final  diagnoses:  Chest pain, unspecified type    Rx / DC Orders ED  Discharge Orders    None       Elizabeth Palau 04/25/20 1818    Geoffery Lyons, MD 04/26/20 [id number]

## 2020-04-25 NOTE — ED Notes (Signed)
Attempted to call report. RN will call back. 
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
## 2020-04-26 ENCOUNTER — Encounter: Payer: 59 | Admitting: Physical Therapy

## 2020-04-26 ENCOUNTER — Observation Stay (HOSPITAL_BASED_OUTPATIENT_CLINIC_OR_DEPARTMENT_OTHER): Payer: 59

## 2020-04-26 DIAGNOSIS — R079 Chest pain, unspecified: Secondary | ICD-10-CM

## 2020-04-26 LAB — ECHOCARDIOGRAM COMPLETE

## 2020-04-26 LAB — RAPID URINE DRUG SCREEN, HOSP PERFORMED
Amphetamines: NOT DETECTED
Barbiturates: NOT DETECTED
Benzodiazepines: NOT DETECTED
Cocaine: POSITIVE — AB
Opiates: NOT DETECTED
Tetrahydrocannabinol: NOT DETECTED

## 2020-04-26 LAB — TROPONIN I (HIGH SENSITIVITY): Troponin I (High Sensitivity): 3 ng/L (ref <18)

## 2020-04-26 MED ORDER — PANTOPRAZOLE SODIUM 40 MG PO TBEC: 40.0000 mg | DELAYED_RELEASE_TABLET | Freq: Every day | ORAL | 0 refills | Status: AC

## 2020-04-26 MED ORDER — PANTOPRAZOLE SODIUM 40 MG PO TBEC
40.0000 mg | DELAYED_RELEASE_TABLET | Freq: Every day | ORAL | Status: DC
  Administered 2020-04-26: 40 mg via ORAL
  Filled 2020-04-26: qty 1

## 2020-04-26 NOTE — Progress Notes (Signed)
Pt discharged to home via POV.  All pts belongings taking with pt. Iv and tele removed.

## 2020-04-26 NOTE — Progress Notes (Signed)
Discharge teaching complete. Meds, diet,activity, follow up appointments reviewed and all questions answered. Copy of instructions given to patient and prescription sent to pharmacy.  

## 2020-04-26 NOTE — Progress Notes (Signed)
  Echocardiogram 2D Echocardiogram has been performed.  Matthew Gaines 04/26/2020, 10:40 AM

## 2020-04-26 NOTE — Progress Notes (Signed)
Continue to await echo reading prior to d/c.  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  Patient's EKG reviewed and he does have notched t waves. JV
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
## 2020-04-30 ENCOUNTER — Encounter: Payer: 59 | Admitting: Gastroenterology

## 2020-05-01 ENCOUNTER — Ambulatory Visit: Payer: 59 | Admitting: Physical Therapy

## 2020-05-01 ENCOUNTER — Telehealth: Payer: Self-pay

## 2020-05-01 NOTE — Telephone Encounter (Signed)
Caller Matthew Gaines   Call Back # [phone]  Patient calling to check status of referral to cardiologist.

## 2020-05-01 NOTE — Telephone Encounter (Signed)
Patient called in to see if he can get a ref for Cardiology on the third floor. Once this is done please follow up with the patient at [phone] as soon as possible.

## 2020-05-02 ENCOUNTER — Telehealth: Payer: Self-pay | Admitting: Medical

## 2020-05-02 DIAGNOSIS — I2510 Atherosclerotic heart disease of native coronary artery without angina pectoris: Secondary | ICD-10-CM

## 2020-05-02 DIAGNOSIS — R0789 Other chest pain: Secondary | ICD-10-CM

## 2020-05-02 DIAGNOSIS — E785 Hyperlipidemia, unspecified: Secondary | ICD-10-CM

## 2020-05-02 NOTE — Telephone Encounter (Signed)
I placed that referral. He has not followed up with me. So advise pt that if he is having chest pain while we wait for cardiologist referral then have to advise ED evaluation.   Also I saw on patient discharge summary that I referred to cardiologist Jolyn Lent tobb in the past. If he has already saw her have him call and ask for appointment. Referral staff working on referral as well.

## 2020-05-02 NOTE — Telephone Encounter (Signed)
Pt.notified

## 2020-05-02 NOTE — Telephone Encounter (Signed)
Referral to cardiologist placed. 

## 2020-05-02 NOTE — Telephone Encounter (Signed)
Patient states that he needs a referral to cardiologist, patient states that he was d/c from ED, patient was told to follow up with cardiologist.  Please Advise

## 2020-05-03 ENCOUNTER — Encounter: Payer: 59 | Admitting: Physical Therapy

## 2020-05-14 ENCOUNTER — Ambulatory Visit: Payer: 59 | Admitting: Cardiology

## 2020-05-16 ENCOUNTER — Other Ambulatory Visit: Payer: Self-pay

## 2020-05-16 ENCOUNTER — Encounter: Payer: Self-pay | Admitting: Cardiology

## 2020-05-16 ENCOUNTER — Encounter: Payer: Self-pay | Admitting: *Deleted

## 2020-05-16 ENCOUNTER — Ambulatory Visit (INDEPENDENT_AMBULATORY_CARE_PROVIDER_SITE_OTHER): Payer: 59 | Admitting: Cardiology

## 2020-05-16 VITALS — BP 128/94 | HR 77 | Ht 73.0 in | Wt 148.0 lb

## 2020-05-16 DIAGNOSIS — E782 Mixed hyperlipidemia: Secondary | ICD-10-CM | POA: Diagnosis not present

## 2020-05-16 DIAGNOSIS — I25110 Atherosclerotic heart disease of native coronary artery with unstable angina pectoris: Secondary | ICD-10-CM | POA: Insufficient documentation

## 2020-05-16 DIAGNOSIS — R079 Chest pain, unspecified: Secondary | ICD-10-CM

## 2020-05-16 MED ORDER — NITROGLYCERIN 0.4 MG SL SUBL: 0.4000 mg | SUBLINGUAL_TABLET | SUBLINGUAL | 3 refills | Status: DC | PRN

## 2020-05-16 NOTE — Progress Notes (Signed)
Cardiology Office Note:    Date:  05/16/2020   ID:  Matthew Gaines, DOB 1961-07-04, MRN [id number]  PCP:  Esperanza Richters, PA-C  Cardiologist:  Thomasene Ripple, DO  Electrophysiologist:  None   Referring MD: Marisue Brooklyn   Chief Complaint  Patient presents with  . New Patient (Initial Visit)    History of Present Illness:    Matthew Gaines is a 59 y.o. 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  Patient tells me that recently he has been experiencing significant chest pressure.  He described the pain as a midsternal pain which is intermittent with no radiation.  He denies any associated shortness of breath.  The patient is very concerned about his chest pain.  Of note he was admitted at [facility] on May 5 for chest pain at that time troponin was done echocardiogram was done which was reassuring and he was discharged to follow-up with cardiology.  Past Medical History:  Diagnosis Date  . Allergy    Q 2-3 yrs has issues   . Arthritis   . Chest pain 04/25/2020  . Chest pain at rest 04/25/2020  . GERD (gastroesophageal reflux disease)    past hx   . Hyperlipidemia   . Overactive bladder   . Poor circulation     Past Surgical History:  Procedure Laterality Date  . CARDIAC CATHETERIZATION  1999  . CIRCUMCISION    . CORONARY ANGIOPLASTY WITH STENT PLACEMENT  1999  . LASER ABLATION OF VASCULAR LESION    . VASCULAR SURGERY      Current Medications: Current Meds  Medication Sig  . aspirin 81 MG chewable tablet Chew 81 mg by mouth daily.   . Hyprom-Naphaz-Polysorb-Zn Sulf (CLEAR EYES COMPLETE OP) Place 1 drop into both eyes daily as needed (for dry eyes).  Marland Kitchen lidocaine (LIDODERM) 5 % Place 1 patch onto the skin daily. Remove & Discard patch within 12 hours or as directed by MD  . Menthol-Methyl Salicylate (MUSCLE RUB) 10-15 % CREA Apply 1 application topically as needed for muscle pain.  .  Multiple Vitamin (MULTIVITAMIN WITH MINERALS) TABS tablet Take 1 tablet by mouth daily.  . Omega-3 Fatty Acids (FISH OIL) 1000 MG CAPS Take 1,000 mg by mouth daily.  . pantoprazole (PROTONIX) 40 MG tablet Take 1 tablet (40 mg total) by mouth daily.     Allergies:   Patient has no known allergies.   Social History   Socioeconomic History  . Marital status: Married    Spouse name: Not on file  . Number of children: Not on file  . Years of education: Not on file  . Highest education level: Not on file  Occupational History  . Not on file  Tobacco Use  . Smoking status: Current Some Day Smoker    Types: Cigarettes  . Smokeless tobacco: Never Used  . Tobacco comment: 2-3 a day - some days none   Substance and Sexual Activity  . Alcohol use: Yes    Comment: occ   . Drug use: Never  . Sexual activity: Not on file  Other Topics Concern  . Not on file  Social History Narrative  . Not on file   Social Determinants of Health   Financial Resource Strain:   . Difficulty of Paying Living Expenses:   Food Insecurity:   . Worried About Programme researcher, broadcasting/film/video in the Last Year:   . [employer]  of Food in the Last Year:   Transportation Needs:   . Freight forwarder (Medical):   Marland Kitchen Lack of Transportation (Non-Medical):   Physical Activity:   . Days of Exercise per Week:   . Minutes of Exercise per Session:   Stress:   . Feeling of Stress :   Social Connections:   . Frequency of Communication with Friends and Family:   . Frequency of Social Gatherings with Friends and Family:   . Attends Religious Services:   . Active Member of Clubs or Organizations:   . Attends Banker Meetings:   Marland Kitchen Marital Status:      Family History: The patient's family history includes Colon polyps in his brother; Lung cancer in his mother. There is no history of Colon cancer, Esophageal cancer, Rectal cancer, or Stomach cancer.  ROS:   Review of Systems  Constitution: Negative for decreased  appetite, fever and weight gain.  HENT: Negative for congestion, ear discharge, hoarse voice and sore throat.   Eyes: Negative for discharge, redness, vision loss in right eye and visual halos.  Cardiovascular: Reports chest pain.  Negative for  dyspnea on exertion, leg swelling, orthopnea and palpitations.  Respiratory: Negative for cough, hemoptysis, shortness of breath and snoring.   Endocrine: Negative for heat intolerance and polyphagia.  Hematologic/Lymphatic: Negative for bleeding problem. Does not bruise/bleed easily.  Skin: Negative for flushing, nail changes, rash and suspicious lesions.  Musculoskeletal: Negative for arthritis, joint pain, muscle cramps, myalgias, neck pain and stiffness.  Gastrointestinal: Negative for abdominal pain, bowel incontinence, diarrhea and excessive appetite.  Genitourinary: Negative for decreased libido, genital sores and incomplete emptying.  Neurological: Negative for brief paralysis, focal weakness, headaches and loss of balance.  Psychiatric/Behavioral: Negative for altered mental status, depression and suicidal ideas.  Allergic/Immunologic: Negative for HIV exposure and persistent infections.    EKGs/Labs/Other Studies Reviewed:    The following studies were reviewed today:   EKG:  The ekg ordered today demonstrates normal sinus rhythm, HR 77 bpm.   TTE IMPRESSIONS Apr 26, 2020 1. Left ventricular ejection fraction, by estimation, is 60 to 65%. The left ventricle has normal function. The left ventricle has no regional wall motion abnormalities. Left ventricular diastolic parameters are  indeterminate.  2. Right ventricular systolic function is normal. The right ventricular size is normal. There is normal pulmonary artery systolic pressure.  3. The mitral valve is normal in structure. Trivial mitral valve regurgitation.  4. The aortic valve is normal in structure. Aortic valve regurgitation is not visualized.  5. The inferior vena cava is  dilated in size with >50% respiratory variability, suggesting right atrial pressure of 8 mmHg.   Recent Labs: 04/02/2020: ALT 14 04/25/2020: BUN 15; Creatinine, Ser 1.09; Hemoglobin 14.7; Platelets 231; Potassium 4.1; Sodium 139  Recent Lipid Panel    Component Value Date/Time   CHOL 184 04/25/2020 1824   TRIG 40 04/25/2020 1824   HDL 63 04/25/2020 1824   CHOLHDL 2.9 04/25/2020 1824   VLDL 8 04/25/2020 1824   LDLCALC 113 (H) 04/25/2020 1824    Physical Exam:    VS:  BP (!) 128/94   Pulse 77   Ht 6\' 1"  (1.854 m)   Wt 148 lb (67.1 kg)   SpO2 97%   BMI 19.53 kg/m     Wt Readings from Last 3 Encounters:  05/16/20 148 lb (67.1 kg)  04/19/20 148 lb (67.1 kg)  04/02/20 150 lb 12.8 oz (68.4 kg)     GEN:  Well nourished, well developed in no acute distress HEENT: Normal NECK: No JVD; No carotid bruits LYMPHATICS: No lymphadenopathy CARDIAC: S1S2 noted,RRR, no murmurs, rubs, gallops RESPIRATORY:  Clear to auscultation without rales, wheezing or rhonchi  ABDOMEN: Soft, non-tender, non-distended, +bowel sounds, no guarding. EXTREMITIES: No edema, No cyanosis, no clubbing MUSCULOSKELETAL:  No deformity  SKIN: Warm and dry NEUROLOGIC:  Alert and oriented x 3, non-focal PSYCHIATRIC:  Normal affect, good insight  ASSESSMENT:    1. Chest pain, unspecified type   2. Coronary artery disease involving native coronary artery of native heart with unstable angina pectoris ([place])   3. Mixed hyperlipidemia    PLAN:     Given his recurrent chest pain I would like to pursue an ischemic evaluation in this patient.  I discussed with the patient about exercise nuclear stress test.  He is willing to proceed with this testing.  Due to his known coronary disease will be beneficial that the patient get exercise testing with imaging.  In the meantime, sublingual nitroglycerin prescription was sent, its protocol and 911 protocol explained and the patient vocalized understanding questions were answered  to the patient's satisfaction.  Hyperlipidemia-LDL is 113.  Given his known history of coronary disease he should be on a statin.  He has been taken off this medication I am going to investigate why and hopefully be able to put the patient back on his statin medication.  He is on omega-3 fatty acid fish oil.  Coronary disease-continue the aspirin, off statin will understand why as he needs to be on a statin medication.  We will pursue ischemic evaluation to assess any progression of his coronary artery disease.   The patient is in agreement with the above plan. The patient left the office in stable condition.  The patient will follow up in 3 months or sooner if needed.   Medication Adjustments/Labs and Tests Ordered: Current medicines are reviewed at length with the patient today.  Concerns regarding medicines are outlined above.  Orders Placed This Encounter  Procedures  . MYOCARDIAL PERFUSION IMAGING  . EKG 12-Lead   Meds ordered this encounter  Medications  . nitroGLYCERIN (NITROSTAT) 0.4 MG SL tablet    Sig: Place 1 tablet (0.4 mg total) under the tongue every 5 (five) minutes as needed.    Dispense:  25 tablet    Refill:  3    Patient Instructions  Medication Instructions:  Your physician has recommended you make the following change in your medication:  1.  START Nitroglycerin 0.4 s/l tablet.  TAKE ONLY AS NEEDED FOR CHEST PAIN. DO NOT TAKE MORE THAN 3 WITHIN 15 MINUTES, IF YOU DO, CALL 911   *If you need a refill on your cardiac medications before your next appointment, please call your pharmacy*   Lab Work: None ordered  If you have labs (blood work) drawn today and your tests are completely normal, you will receive your results only by: Marland Kitchen MyChart Message (if you have MyChart) OR . A paper copy in the mail If you have any lab test that is abnormal or we need to change your treatment, we will call you to review the results.   Testing/Procedures: Your physician has  requested that you have en exercise stress myoview. For further information please visit [email or web address]. Please follow instruction sheet, as given.     Follow-Up: At [facility], you and your health needs are our priority.  As part of our continuing mission to provide you with exceptional heart care,  we have created designated Provider Care Teams.  These Care Teams include your primary Cardiologist (physician) and Advanced Practice Providers (APPs -  Physician Assistants and Nurse Practitioners) who all work together to provide you with the care you need, when you need it.  We recommend signing up for the patient portal called "MyChart".  Sign up information is provided on this After Visit Summary.  MyChart is used to connect with patients for Virtual Visits (Telemedicine).  Patients are able to view lab/test results, encounter notes, upcoming appointments, etc.  Non-urgent messages can be sent to your provider as well.   To learn more about what you can do with MyChart, go to [email or web address].    Your next appointment:   3 month(s)  The format for your next appointment:   In Person  Provider:   Dr. Thomasene RippleKardie Sheera Illingworth   Other Instructions  Exercise Stress Test  An exercise stress test is a test that is done to collect information about how your heart functions during exercise. The test is done while you are walking on a treadmill or using an exercise bike. The goal is to raise your heart rate and "stress" the heart. The heart is evaluated before, during, and after you exercise. An electrocardiogram (ECG) will be used to monitor the heart, and your blood pressure will also be monitored. In some cases, nuclear scanning or an ultrasound of the heart (echocardiogram) will also be done to evaluate your heart. An exercise stress test is done to look for coronary artery disease (CAD). The test may also be done to:  Evaluate your limits of exercise during cardiac rehabilitation.  Check  for high blood pressure during exercise.  Check how well you can exercise after such treatments as coronary stenting or new medicines.  Check for problems with blood flow to your arms and legs during exercise. If you have an abnormal test result, this may mean that you are not getting enough blood flow to your heart during exercise. More testing may be needed to understand why your test was not normal. Tell a health care provider about:  Any allergies you have.  All medicines you are taking, including vitamins, herbs, eye drops, creams, and over-the-counter medicines.  Any blood disorders you have.  Any surgeries you have had.  Any medical conditions you have.  Whether you are pregnant or may be pregnant. What are the risks? Generally, this is a safe procedure. However, problems may occur, including:  Pain or pressure in the following areas: ? Chest. ? Jaw or neck. ? Between your shoulder blades. ? Down your left arm.  Dizziness or lightheadedness.  Shortness of breath.  Increased or irregular heartbeats.  Nausea or vomiting.  Heart attack (rare).  Life-threatening abnormal heart rhythm (rare). What happens before the procedure?  Follow instructions from your health care provider about eating or drinking restrictions. ? You may be told to avoid all forms of caffeine for 24 hours before the test. This includes coffee, tea (even decaffeinated tea), caffeinated sodas, chocolate, cocoa, and certain pain medicines.  Ask your health care provider about: ? Taking over-the-counter medicines, vitamins, herbs, and supplements. ? Changing or stopping your regular medicines. This is especially important if you are taking diabetes medicines or beta-blocker medicines.  If you have diabetes, ask how you are to take your insulin or pills. It is common to adjust your insulin dose the morning of the test.  If you are taking beta-blocker medicines, it is important to talk to your  health care provider about these medicines well before the date of your test. Taking beta-blocker medicines may interfere with the test. In some cases, these medicines may need to be changed or stopped 24 hours or more before the test.  If you wear a nitroglycerin patch, it may need to be removed prior to the test. Ask your health care provider if the patch should be removed before the test.  If you use an inhaler for any breathing condition, bring it with you to the test.  Do not apply lotions, powders, creams, or oils on your chest prior to the test.  Wear loose-fitting clothes and comfortable walking shoes.  Do not use any products that contain nicotine or tobacco, such as cigarettes and e-cigarettes, for 4 hours before the test or as told by your health care provider. If you need help quitting, ask your health care provider. What happens during the procedure?  Multiple electrodes will be attached to your chest.  Multiple wires will be attached to the electrodes. These will transfer the electrical impulses from your heart to the ECG machine. Your heart will be monitored both at rest and while exercising.  If you are also having an echocardiogram or nuclear scanning, images of your heart will be taken before and after you exercise.  A blood pressure cuff will be placed around your arm to measure your blood pressure throughout the test. You will feel it tighten and loosen throughout the test.  You will walk on a treadmill or use a stationary bike. If you cannot use these, you may be asked to turn a crank with your hands.  You will start at a slow pace or level on the exercise machine. The exercise difficulty will be slowly increased to raise your heart rate. In the case of a treadmill, the speed and incline will gradually be increased.  You may be asked to periodically breathe into a tube. This measures the gases you breathe out.  You will be asked how you are feeling throughout the test.  You will be asked to rate your level of exertion.  Tell the staff right away if you feel: ? Chest pain. ? Dizziness. ? Shortness of breath. ? Too fatigued to continue. ? Pain or aching in your legs or arms.  You will exercise until you have symptoms or until you reach a target heart rate. The test will also be stopped if you have changes in your blood pressure or ECG readings, or if you develop an irregular heartbeat (arrhythmia). The procedure may vary among health care providers and hospitals. What happens after the procedure?  You will sit down and recover from the exercise. Your blood pressure, heart rate, and ECG will be monitored until you recover.  You may return to your normal schedule, including diet, activities, and medicines, unless your health care provider tells you otherwise.  It is up to you to get your test results. Ask your health care provider, or the department that is doing the test, when your results will be ready. Summary  An exercise stress test is a test that is done to collect information about how your heart functions during exercise.  This test is done to look for coronary artery disease (CAD).  During this test, you will walk on a treadmill or use an exercise bike to raise your heart rate.  It is important to follow instructions from your health care provider about eating and drinking restrictions before the test. This may include avoiding  caffeine and certain medicines before the test. This information is not intended to replace advice given to you by your health care provider. Make sure you discuss any questions you have with your health care provider. Document Revised: 09/10/2017 Document Reviewed: 02/11/2017 Elsevier Patient Education  2020 [employer].      Adopting a Healthy Lifestyle.  Know what a healthy weight is for you (roughly BMI <25) and aim to maintain this   Aim for 7+ servings of fruits and vegetables daily   65-80+ fluid ounces  of water or unsweet tea for healthy kidneys   Limit to max 1 drink of alcohol per day; avoid smoking/tobacco   Limit animal fats in diet for cholesterol and heart health - choose grass fed whenever available   Avoid highly processed foods, and foods high in saturated/trans fats   Aim for low stress - take time to unwind and care for your mental health   Aim for 150 min of moderate intensity exercise weekly for heart health, and weights twice weekly for bone health   Aim for 7-9 hours of sleep daily   When it comes to diets, agreement about the perfect plan isnt easy to find, even among the experts. Experts at the [facility] of [employer] developed an idea known as the Healthy Eating Plate. Just imagine a plate divided into logical, healthy portions.   The emphasis is on diet quality:   Load up on vegetables and fruits - one-half of your plate: Aim for color and variety, and remember that potatoes dont count.   Go for whole grains - one-quarter of your plate: Whole wheat, barley, wheat berries, quinoa, oats, brown rice, and foods made with them. If you want pasta, go with whole wheat pasta.   Protein power - one-quarter of your plate: Fish, chicken, beans, and nuts are all healthy, versatile protein sources. Limit red meat.   The diet, however, does go beyond the plate, offering a few other suggestions.   Use healthy plant oils, such as olive, canola, soy, corn, sunflower and peanut. Check the labels, and avoid partially hydrogenated oil, which have unhealthy trans fats.   If youre thirsty, drink water. Coffee and tea are good in moderation, but skip sugary drinks and limit milk and dairy products to one or two daily servings.   The type of carbohydrate in the diet is more important than the amount. Some sources of carbohydrates, such as vegetables, fruits, whole grains, and beans-are healthier than others.   Finally, stay active  Signed, Thomasene Ripple, DO  05/16/2020 7:14  PM    [facility]

## 2020-05-16 NOTE — Patient Instructions (Addendum)
Medication Instructions:  Your physician has recommended you make the following change in your medication:  1.  START Nitroglycerin 0.4 s/l tablet.  TAKE ONLY AS NEEDED FOR CHEST PAIN. DO NOT TAKE MORE THAN 3 WITHIN 15 MINUTES, IF YOU DO, CALL 911   *If you need a refill on your cardiac medications before your next appointment, please call your pharmacy*   Lab Work: None ordered  If you have labs (blood work) drawn today and your tests are completely normal, you will receive your results only by: Marland Kitchen MyChart Message (if you have MyChart) OR . A paper copy in the mail If you have any lab test that is abnormal or we need to change your treatment, we will call you to review the results.   Testing/Procedures: Your physician has requested that you have en exercise stress myoview. For further information please visit [email or web address] Please follow instruction sheet, as given.     Follow-Up: At [facility], you and your health needs are our priority.  As part of our continuing mission to provide you with exceptional heart care, we have created designated Provider Care Teams.  These Care Teams include your primary Cardiologist (physician) and Advanced Practice Providers (APPs -  Physician Assistants and Nurse Practitioners) who all work together to provide you with the care you need, when you need it.  We recommend signing up for the patient portal called "MyChart".  Sign up information is provided on this After Visit Summary.  MyChart is used to connect with patients for Virtual Visits (Telemedicine).  Patients are able to view lab/test results, encounter notes, upcoming appointments, etc.  Non-urgent messages can be sent to your provider as well.   To learn more about what you can do with MyChart, go to [email or web address].    Your next appointment:   3 month(s)  The format for your next appointment:   In Person  Provider:   Dr. Thomasene Ripple   Other Instructions  Exercise  Stress Test  An exercise stress test is a test that is done to collect information about how your heart functions during exercise. The test is done while you are walking on a treadmill or using an exercise bike. The goal is to raise your heart rate and "stress" the heart. The heart is evaluated before, during, and after you exercise. An electrocardiogram (ECG) will be used to monitor the heart, and your blood pressure will also be monitored. In some cases, nuclear scanning or an ultrasound of the heart (echocardiogram) will also be done to evaluate your heart. An exercise stress test is done to look for coronary artery disease (CAD). The test may also be done to:  Evaluate your limits of exercise during cardiac rehabilitation.  Check for high blood pressure during exercise.  Check how well you can exercise after such treatments as coronary stenting or new medicines.  Check for problems with blood flow to your arms and legs during exercise. If you have an abnormal test result, this may mean that you are not getting enough blood flow to your heart during exercise. More testing may be needed to understand why your test was not normal. Tell a health care provider about:  Any allergies you have.  All medicines you are taking, including vitamins, herbs, eye drops, creams, and over-the-counter medicines.  Any blood disorders you have.  Any surgeries you have had.  Any medical conditions you have.  Whether you are pregnant or may be pregnant. What are the risks? Generally, this  is a safe procedure. However, problems may occur, including:  Pain or pressure in the following areas: ? Chest. ? Jaw or neck. ? Between your shoulder blades. ? Down your left arm.  Dizziness or lightheadedness.  Shortness of breath.  Increased or irregular heartbeats.  Nausea or vomiting.  Heart attack (rare).  Life-threatening abnormal heart rhythm (rare). What happens before the procedure?  Follow  instructions from your health care provider about eating or drinking restrictions. ? You may be told to avoid all forms of caffeine for 24 hours before the test. This includes coffee, tea (even decaffeinated tea), caffeinated sodas, chocolate, cocoa, and certain pain medicines.  Ask your health care provider about: ? Taking over-the-counter medicines, vitamins, herbs, and supplements. ? Changing or stopping your regular medicines. This is especially important if you are taking diabetes medicines or beta-blocker medicines.  If you have diabetes, ask how you are to take your insulin or pills. It is common to adjust your insulin dose the morning of the test.  If you are taking beta-blocker medicines, it is important to talk to your health care provider about these medicines well before the date of your test. Taking beta-blocker medicines may interfere with the test. In some cases, these medicines may need to be changed or stopped 24 hours or more before the test.  If you wear a nitroglycerin patch, it may need to be removed prior to the test. Ask your health care provider if the patch should be removed before the test.  If you use an inhaler for any breathing condition, bring it with you to the test.  Do not apply lotions, powders, creams, or oils on your chest prior to the test.  Wear loose-fitting clothes and comfortable walking shoes.  Do not use any products that contain nicotine or tobacco, such as cigarettes and e-cigarettes, for 4 hours before the test or as told by your health care provider. If you need help quitting, ask your health care provider. What happens during the procedure?  Multiple electrodes will be attached to your chest.  Multiple wires will be attached to the electrodes. These will transfer the electrical impulses from your heart to the ECG machine. Your heart will be monitored both at rest and while exercising.  If you are also having an echocardiogram or nuclear  scanning, images of your heart will be taken before and after you exercise.  A blood pressure cuff will be placed around your arm to measure your blood pressure throughout the test. You will feel it tighten and loosen throughout the test.  You will walk on a treadmill or use a stationary bike. If you cannot use these, you may be asked to turn a crank with your hands.  You will start at a slow pace or level on the exercise machine. The exercise difficulty will be slowly increased to raise your heart rate. In the case of a treadmill, the speed and incline will gradually be increased.  You may be asked to periodically breathe into a tube. This measures the gases you breathe out.  You will be asked how you are feeling throughout the test. You will be asked to rate your level of exertion.  Tell the staff right away if you feel: ? Chest pain. ? Dizziness. ? Shortness of breath. ? Too fatigued to continue. ? Pain or aching in your legs or arms.  You will exercise until you have symptoms or until you reach a target heart rate. The test will also  be stopped if you have changes in your blood pressure or ECG readings, or if you develop an irregular heartbeat (arrhythmia). The procedure may vary among health care providers and hospitals. What happens after the procedure?  You will sit down and recover from the exercise. Your blood pressure, heart rate, and ECG will be monitored until you recover.  You may return to your normal schedule, including diet, activities, and medicines, unless your health care provider tells you otherwise.  It is up to you to get your test results. Ask your health care provider, or the department that is doing the test, when your results will be ready. Summary  An exercise stress test is a test that is done to collect information about how your heart functions during exercise.  This test is done to look for coronary artery disease (CAD).  During this test, you will  walk on a treadmill or use an exercise bike to raise your heart rate.  It is important to follow instructions from your health care provider about eating and drinking restrictions before the test. This may include avoiding caffeine and certain medicines before the test. This information is not intended to replace advice given to you by your health care provider. Make sure you discuss any questions you have with your health care provider. Document Revised: 09/10/2017 Document Reviewed: 02/11/2017 Elsevier Patient Education  2020 [employer].

## 2020-05-28 ENCOUNTER — Ambulatory Visit (INDEPENDENT_AMBULATORY_CARE_PROVIDER_SITE_OTHER): Payer: 59 | Admitting: Family Medicine

## 2020-05-28 ENCOUNTER — Telehealth: Payer: Self-pay | Admitting: Cardiology

## 2020-05-28 ENCOUNTER — Other Ambulatory Visit: Payer: Self-pay

## 2020-05-28 ENCOUNTER — Encounter: Payer: Self-pay | Admitting: Family Medicine

## 2020-05-28 DIAGNOSIS — M25511 Pain in right shoulder: Secondary | ICD-10-CM | POA: Diagnosis not present

## 2020-05-28 DIAGNOSIS — M25512 Pain in left shoulder: Secondary | ICD-10-CM | POA: Diagnosis not present

## 2020-05-28 DIAGNOSIS — I25110 Atherosclerotic heart disease of native coronary artery with unstable angina pectoris: Secondary | ICD-10-CM | POA: Diagnosis not present

## 2020-05-28 DIAGNOSIS — M542 Cervicalgia: Secondary | ICD-10-CM | POA: Diagnosis not present

## 2020-05-28 DIAGNOSIS — G8929 Other chronic pain: Secondary | ICD-10-CM
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
## 2020-05-28 NOTE — Telephone Encounter (Signed)
Do you know if we have anything setup for this?

## 2020-05-28 NOTE — Telephone Encounter (Signed)
Matthew Gaines from [facility] [employer] calling to see if the patient's ride to his appointment 05/31/2020 can be covered by the clinic.

## 2020-05-29 ENCOUNTER — Telehealth: Payer: Self-pay | Admitting: Cardiology

## 2020-05-29 NOTE — Telephone Encounter (Signed)
Follow Up:    Pt was returning a call, did not know who alled and was not sure if the call was today.

## 2020-05-29 NOTE — Telephone Encounter (Signed)
Message left for pt to call back. Pt will no longer need a COVID test for his upcoming stress test.

## 2020-05-29 NOTE — Telephone Encounter (Signed)
Patient is returning call.  °

## 2020-05-29 NOTE — Telephone Encounter (Signed)
Pt states that he understands that he needs to have the covid test done on 05/31/20 as already scheduled.

## 2020-05-30 ENCOUNTER — Encounter (HOSPITAL_COMMUNITY): Payer: Self-pay | Admitting: *Deleted

## 2020-05-30 ENCOUNTER — Telehealth (HOSPITAL_COMMUNITY): Payer: Self-pay | Admitting: *Deleted

## 2020-05-30 NOTE — Telephone Encounter (Signed)
Patient given detailed instructions per Myocardial Perfusion Study Information Sheet for the test on 06/04/20 at 8:00. Patient notified to arrive 15 minutes early and that it is imperative to arrive on time for appointment to keep from having the test rescheduled.  If you need to cancel or reschedule your appointment, please call the office within 24 hours of your appointment. . Patient verbalized understanding.Matthew Gaines   Also sent an instruction letter via mail to the patient per his request.

## 2020-05-31 ENCOUNTER — Other Ambulatory Visit (HOSPITAL_COMMUNITY): Payer: 59

## 2020-06-04 ENCOUNTER — Encounter (HOSPITAL_COMMUNITY): Payer: 59

## 2020-06-04 ENCOUNTER — Other Ambulatory Visit: Payer: Self-pay | Admitting: *Deleted

## 2020-06-04 ENCOUNTER — Telehealth (HOSPITAL_COMMUNITY): Payer: Self-pay | Admitting: Cardiology

## 2020-06-04 DIAGNOSIS — M25562 Pain in left knee: Secondary | ICD-10-CM

## 2020-06-04 DIAGNOSIS — M25561 Pain in right knee: Secondary | ICD-10-CM

## 2020-06-04 NOTE — Telephone Encounter (Signed)
Patient called this am and cancelled his Lexiscan for today due to he was having chest pressure and took a Nitroglycerin.  His wife was unable to bring him and he did not want to drive.  I asked patient did the NITRO help and he stated yes and I told patient at any point and time if he felt bad that he should not hesitate to call 911 or proceed to the emergency room. He rescheduled his appointment til Wednesday 06/06/2020.

## 2020-06-05 ENCOUNTER — Telehealth (HOSPITAL_COMMUNITY): Payer: Self-pay

## 2020-06-05 NOTE — Telephone Encounter (Signed)

## 2020-06-06 ENCOUNTER — Other Ambulatory Visit: Payer: Self-pay

## 2020-06-06 ENCOUNTER — Ambulatory Visit (HOSPITAL_COMMUNITY): Payer: 59 | Attending: Cardiology

## 2020-06-06 ENCOUNTER — Ambulatory Visit (HOSPITAL_COMMUNITY): Payer: 59

## 2020-06-06 DIAGNOSIS — R079 Chest pain, unspecified: Secondary | ICD-10-CM | POA: Diagnosis present

## 2020-06-06 DIAGNOSIS — I25110 Atherosclerotic heart disease of native coronary artery with unstable angina pectoris: Secondary | ICD-10-CM | POA: Insufficient documentation

## 2020-06-06 LAB — MYOCARDIAL PERFUSION IMAGING
Estimated workload: 16.2 METS
Exercise duration (min): 13 min
Exercise duration (sec): 30 s
LV dias vol: 98 mL (ref 62–150)
LV sys vol: 47 mL
MPHR: 160 {beats}/min
Peak HR: 162 {beats}/min
Percent HR: 101 %
RPE: 19
Rest HR: 58 {beats}/min
SDS: 0
SRS: 0
SSS: 0
TID: 0.96

## 2020-06-06 MED ORDER — TECHNETIUM TC 99M TETROFOSMIN IV KIT
30.7000 | PACK | Freq: Once | INTRAVENOUS | Status: AC | PRN
  Administered 2020-06-06: 30.7 via INTRAVENOUS
  Filled 2020-06-06: qty 31

## 2020-06-06 MED ORDER — TECHNETIUM TC 99M TETROFOSMIN IV KIT
9.4000 | PACK | Freq: Once | INTRAVENOUS | Status: AC | PRN
  Administered 2020-06-06: 9.4 via INTRAVENOUS
  Filled 2020-06-06: qty 10

## 2020-06-07 ENCOUNTER — Telehealth: Payer: Self-pay

## 2020-06-07 NOTE — Telephone Encounter (Signed)
Patient has returned your call °Please call °

## 2020-06-07 NOTE — Telephone Encounter (Signed)
-----   Message from Alver Sorrow, NP sent at 06/06/2020  8:03 PM EDT ----- Stress test with normal pumping function. No evidence of ischemia. Great result!

## 2020-06-07 NOTE — Telephone Encounter (Signed)
Spoke with patient/patients wife regarding results and recommendation.  They verbalizes understanding and is agreeable to plan of care. Advised patient to call back with any issues or concerns.

## 2020-06-07 NOTE — Telephone Encounter (Signed)
Left message on patients voicemail to please return our call.   

## 2020-06-12 ENCOUNTER — Ambulatory Visit: Payer: 59 | Attending: Family Medicine | Admitting: Physical Therapy

## 2020-06-12 ENCOUNTER — Encounter: Payer: Self-pay | Admitting: Physical Therapy

## 2020-06-12 ENCOUNTER — Other Ambulatory Visit: Payer: Self-pay

## 2020-06-12 VITALS — BP 98/70 | HR 66

## 2020-06-12 DIAGNOSIS — M25512 Pain in left shoulder: Secondary | ICD-10-CM | POA: Insufficient documentation

## 2020-06-12 DIAGNOSIS — R293 Abnormal posture: Secondary | ICD-10-CM | POA: Insufficient documentation

## 2020-06-12 DIAGNOSIS — G8929 Other chronic pain: Secondary | ICD-10-CM | POA: Insufficient documentation

## 2020-06-12 DIAGNOSIS — R29898 Other symptoms and signs involving the musculoskeletal system: Secondary | ICD-10-CM | POA: Diagnosis present

## 2020-06-12 DIAGNOSIS — M25612 Stiffness of left shoulder, not elsewhere classified: Secondary | ICD-10-CM | POA: Diagnosis present
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
## 2020-06-13 ENCOUNTER — Ambulatory Visit: Payer: 59 | Admitting: Gastroenterology

## 2020-06-14 ENCOUNTER — Telehealth: Payer: Self-pay | Admitting: Cardiology

## 2020-06-14 NOTE — Telephone Encounter (Signed)
Spoke to the patient just now and he let me know that he has been having these intermittent episodes of chest pain. He states that the chest pain generally lasts about 10 minutes or less. He states that during these episodes he does have SOB and sweating along with the chest pain. He takes his Nitroglycerin and aspirin when this happens and states that it does help but he is concerned because of the increased episodes he is having. He does not have any vital signs that he is able to share with me at this time and is unable to take any for me right now.   I will route to Dr. Dulce Sellar for further Francee Piccolo since Dr. Servando Salina is out of town this week.

## 2020-06-14 NOTE — Telephone Encounter (Signed)
He is a med [employer] patient, can Dr. Tomie China see him tomorrow if not he should be put into beginning of the week at that office.

## 2020-06-14 NOTE — Telephone Encounter (Signed)
Spoke with patient regarding recommendations. He verbalizes understanding and is agreeable to see Dr. Tomie China tomorrow in [place]. I got him scheduled for 10:40AM and he is aware and happy with this.    Encouraged patient to call back with any questions or concerns.

## 2020-06-14 NOTE — Telephone Encounter (Signed)
Follow Up  Patient is calling in to see if anyone has calling the office to assist with transportation for him. Please advise.

## 2020-06-14 NOTE — Telephone Encounter (Signed)
Spoke to the patient just now and he said that he was wanting to know if we had heard from a transportation company in regards to setting up transportation for him. I stated that I had not seen anything as of this time. He verbalizes understanding and thanks me for the call back.

## 2020-06-14 NOTE — Telephone Encounter (Signed)
Pt c/o of Chest Pain: STAT if CP now or developed within 24 hours  1. Are you having CP right now? No   2. Are you experiencing any other symptoms (ex. SOB, nausea, vomiting, sweating)? SOB with the chest pain and pressure.   3. How long have you been experiencing CP? Tuesday and Wednesday, and one last week.    4. Is your CP continuous or coming and going? Comes and goes   5. Have you taken Nitroglycerin? Yes.  He also took 81mg  aspirin  States he had an x-ray done, has not gotten a call about the results.   ?

## 2020-06-15 ENCOUNTER — Encounter: Payer: Self-pay | Admitting: Cardiology

## 2020-06-15 ENCOUNTER — Other Ambulatory Visit: Payer: Self-pay

## 2020-06-15 ENCOUNTER — Ambulatory Visit (INDEPENDENT_AMBULATORY_CARE_PROVIDER_SITE_OTHER): Payer: 59 | Admitting: Cardiology

## 2020-06-15 VITALS — BP 120/90 | HR 68 | Ht 73.0 in | Wt 147.0 lb

## 2020-06-15 DIAGNOSIS — I208 Other forms of angina pectoris: Secondary | ICD-10-CM

## 2020-06-15 DIAGNOSIS — F1410 Cocaine abuse, uncomplicated: Secondary | ICD-10-CM

## 2020-06-15 DIAGNOSIS — E782 Mixed hyperlipidemia: Secondary | ICD-10-CM | POA: Diagnosis not present

## 2020-06-15 DIAGNOSIS — I209 Angina pectoris, unspecified: Secondary | ICD-10-CM | POA: Diagnosis not present

## 2020-06-15 MED ORDER — METOPROLOL TARTRATE 100 MG PO TABS
100.0000 mg | ORAL_TABLET | Freq: Once | ORAL | 0 refills | Status: DC
Start: 2020-06-15 — End: 2020-07-16

## 2020-06-15 NOTE — Progress Notes (Signed)
Cardiology Office Note:    Date:  06/15/2020   ID:  [name], DOB 1961-05-16, MRN [id number]  PCP:  Esperanza Richters, PA-C  Cardiologist:  Garwin Brothers, MD   Referring MD: Esperanza Richters, PA-C    ASSESSMENT:   1.  Angina pectoris 2.  Mixed dyslipidemia 3.  Urine screen positive for cocaine a month ago  PLAN:    In order of problems listed above:  1. Angina pectoris: Secondary prevention stressed with the patient.  Importance of compliance with diet medication stressed and vocalized understanding.  His symptoms are concerning and therefore a discussed invasive and noninvasive evaluations.  He prefer CT coronary angiography with FFR and we will set him up for the same.  Nitroglycerin use was advised.  If this does not help he knows to call 911 and go to nearest emergency room.  Importance of compliance with diet medication stressed and he vocalized understanding 2. Mixed dyslipidemia: Lipids followed by his primary care physician and my partners can I reviewed them with him. 3. Cocaine positive for his urine screen and I discussed against drug use and he promises to comply. 4. Will patient follow-up appointment after CT FFR by his primary cardiologist in our practice.  He had multiple questions which were answered to his satisfaction.   Medication Adjustments/Labs and Tests Ordered: Current medicines are reviewed at length with the patient today.  Concerns regarding medicines are outlined above.  No orders of the defined types were placed in this encounter.  No orders of the defined types were placed in this encounter.    No chief complaint on file.    History of Present Illness:    Matthew Gaines is a 59 y.o. male patient is a gentleman followed by my partners.  He has had history of coronary artery disease.  He mentions to me that he had chest tightness a couple of days ago and uses sublingual nitroglycerin prescription was sent, its protocol and 911 protocol explained  and the patient vocalized understanding questions were answered to the patient's satisfaction on 2 occasions and this relieved the pain.  No orthopnea or PND.  He recently had a stress test and echocardiogram which was unremarkable.  His urine test has been positive for cocaine in the past according to the records.  At the time of my evaluation, the patient is alert awake oriented and in no distress.  Past Medical History:  Diagnosis Date  . Allergy    Q 2-3 yrs has issues   . Arthritis   . Chest pain 04/25/2020  . Chest pain at rest 04/25/2020  . GERD (gastroesophageal reflux disease)    past hx   . Hyperlipidemia   . Overactive bladder   . Poor circulation     Past Surgical History:  Procedure Laterality Date  . CARDIAC CATHETERIZATION  1999  . CIRCUMCISION    . CORONARY ANGIOPLASTY WITH STENT PLACEMENT  1999  . LASER ABLATION OF VASCULAR LESION    . VASCULAR SURGERY      Current Medications: Current Meds  Medication Sig  . aspirin 81 MG chewable tablet Chew 81 mg by mouth daily.   . Hyprom-Naphaz-Polysorb-Zn Sulf (CLEAR EYES COMPLETE OP) Place 1 drop into both eyes daily as needed (for dry eyes).  Marland Kitchen lidocaine (LIDODERM) 5 % Place 1 patch onto the skin daily. Remove & Discard patch within 12 hours or as directed by MD  . Menthol-Methyl Salicylate (MUSCLE RUB) 10-15 % CREA Apply 1 application topically as  needed for muscle pain.  . Multiple Vitamin (MULTIVITAMIN WITH MINERALS) TABS tablet Take 1 tablet by mouth daily.  . nitroGLYCERIN (NITROSTAT) 0.4 MG SL tablet Place 1 tablet (0.4 mg total) under the tongue every 5 (five) minutes as needed.  . Omega-3 Fatty Acids (FISH OIL) 1000 MG CAPS Take 1,000 mg by mouth daily.  . pantoprazole (PROTONIX) 40 MG tablet Take 1 tablet (40 mg total) by mouth daily.     Allergies:   Patient has no known allergies.   Social History   Socioeconomic History  . Marital status: Married    Spouse name: Not on file  . Number of children: Not on  file  . Years of education: Not on file  . Highest education level: Not on file  Occupational History  . Not on file  Tobacco Use  . Smoking status: Current Some Day Smoker    Types: Cigarettes  . Smokeless tobacco: Never Used  . Tobacco comment: 2-3 a day - some days none   Substance and Sexual Activity  . Alcohol use: Yes    Comment: occ   . Drug use: Never  . Sexual activity: Not on file  Other Topics Concern  . Not on file  Social History Narrative  . Not on file   Social Determinants of Health   Financial Resource Strain:   . Difficulty of Paying Living Expenses:   Food Insecurity:   . Worried About Programme researcher, broadcasting/film/video in the Last Year:   . Barista in the Last Year:   Transportation Needs:   . Freight forwarder (Medical):   Marland Kitchen Lack of Transportation (Non-Medical):   Physical Activity:   . Days of Exercise per Week:   . Minutes of Exercise per Session:   Stress:   . Feeling of Stress :   Social Connections:   . Frequency of Communication with Friends and Family:   . Frequency of Social Gatherings with Friends and Family:   . Attends Religious Services:   . Active Member of Clubs or Organizations:   . Attends Banker Meetings:   Marland Kitchen Marital Status:      Family History: The patient's family history includes Colon polyps in his brother; Lung cancer in his mother. There is no history of Colon cancer, Esophageal cancer, Rectal cancer, or Stomach cancer.  ROS:   Please see the history of present illness.    All other systems reviewed and are negative.  EKGs/Labs/Other Studies Reviewed:    The following studies were reviewed today: I discussed my findings with the patient at extensive length including stress test and echocardiogram report and they were unremarkable   Recent Labs: 04/02/2020: ALT 14 04/25/2020: BUN 15; Creatinine, Ser 1.09; Hemoglobin 14.7; Platelets 231; Potassium 4.1; Sodium 139  Recent Lipid Panel    Component Value  Date/Time   CHOL 184 04/25/2020 1824   TRIG 40 04/25/2020 1824   HDL 63 04/25/2020 1824   CHOLHDL 2.9 04/25/2020 1824   VLDL 8 04/25/2020 1824   LDLCALC 113 (H) 04/25/2020 1824    Physical Exam:    VS:  BP 120/90   Pulse 68   Ht 6\' 1"  (1.854 m)   Wt 147 lb (66.7 kg)   SpO2 99%   BMI 19.39 kg/m     Wt Readings from Last 3 Encounters:  06/15/20 147 lb (66.7 kg)  06/06/20 148 lb (67.1 kg)  05/16/20 148 lb (67.1 kg)     GEN: Patient  is in no acute distress HEENT: Normal NECK: No JVD; No carotid bruits LYMPHATICS: No lymphadenopathy CARDIAC: Hear sounds regular, 2/6 systolic murmur at the apex. RESPIRATORY:  Clear to auscultation without rales, wheezing or rhonchi  ABDOMEN: Soft, non-tender, non-distended MUSCULOSKELETAL:  No edema; No deformity  SKIN: Warm and dry NEUROLOGIC:  Alert and oriented x 3 PSYCHIATRIC:  Normal affect   Signed, Jenean Lindau, MD  06/15/2020 11:24 AM    [place]

## 2020-06-15 NOTE — Patient Instructions (Signed)
Medication Instructions:  No medication changes. *If you need a refill on your cardiac medications before your next appointment, please call your pharmacy*   Lab Work: Your physician recommends that you return for lab work in: labs in 1 week prior to your CT. You can come Monday through Friday 8:30 am to 12:00 pm and 1:15 to 4:30. You do not need to make an appointment as the order has already been placed.    If you have labs (blood work) drawn today and your tests are completely normal, you will receive your results only by: Marland Kitchen MyChart Message (if you have MyChart) OR . A paper copy in the mail If you have any lab test that is abnormal or we need to change your treatment, we will call you to review the results.   Testing/Procedures: Your cardiac CT will be scheduled at:   [facility] [address] [place], [place] [address] [phone]   If scheduled at [facility], please arrive at the [facility] main entrance of [facility] 30 minutes prior to test start time. Proceed to the [facility] Radiology Department (first floor) to check-in and test prep.  Please follow these instructions carefully (unless otherwise directed):  Hold all erectile dysfunction medications at least 3 days (72 hrs) prior to test.  On the Night Before the Test: . Be sure to Drink plenty of water. . Do not consume any caffeinated/decaffeinated beverages or chocolate 12 hours prior to your test. . Do not take any antihistamines 12 hours prior to your test.   On the Day of the Test: . Drink plenty of water. Do not drink any water within one hour of the test. . Do not eat any food 4 hours prior to the test. . You may take your regular medications prior to the test.  . Take metoprolol (Lopressor) two hours prior to test.        After the Test: . Drink plenty of water. . After receiving IV contrast, you may experience a mild flushed feeling. This is normal. . On  occasion, you may experience a mild rash up to 24 hours after the test. This is not dangerous. If this occurs, you can take Benadryl 25 mg and increase your fluid intake. . If you experience trouble breathing, this can be serious. If it is severe call 911 IMMEDIATELY. If it is mild, please call our office. . If you take any of these medications: Glipizide/Metformin, Avandament, Glucavance, please do not take 48 hours after completing test unless otherwise instructed.   Once we have confirmed authorization from your insurance company, we will call you to set up a date and time for your test.   For non-scheduling related questions, please contact the cardiac imaging nurse navigator should you have any questions/concerns: Marchia Bond, Cardiac Imaging Nurse Navigator Burley Saver, Interim Cardiac Imaging Nurse [place] [facility] Direct Office Dial: [phone]   For scheduling needs, including cancellations and rescheduling, please call [phone].      Follow-Up: At [facility], you and your health needs are our priority.  As part of our continuing mission to provide you with exceptional heart care, we have created designated Provider Care Teams.  These Care Teams include your primary Cardiologist (physician) and Advanced Practice Providers (APPs -  Physician Assistants and Nurse Practitioners) who all work together to provide you with the care you need, when you need it.  We recommend signing up for the patient  portal called "MyChart".  Sign up information is provided on this After Visit Summary.  MyChart is used to connect with patients for Virtual Visits (Telemedicine).  Patients are able to view lab/test results, encounter notes, upcoming appointments, etc.  Non-urgent messages can be sent to your provider as well.   To learn more about what you can do with MyChart, go to [email or web address].    Your next appointment:   3 month(s)  The format for your  next appointment:   In Person  Provider:   Berniece Salines, DO   Other Instructions NA

## 2020-06-18 ENCOUNTER — Telehealth: Payer: Self-pay | Admitting: Cardiology

## 2020-06-18 DIAGNOSIS — I25110 Atherosclerotic heart disease of native coronary artery with unstable angina pectoris: Secondary | ICD-10-CM

## 2020-06-18 NOTE — Telephone Encounter (Signed)
Patient [facility] an [facility] so his insurance will cover the cost.

## 2020-06-18 NOTE — Telephone Encounter (Signed)
Jakie is calling back to follow up on the status of the prescription being sent. Please advise.

## 2020-06-19 ENCOUNTER — Other Ambulatory Visit: Payer: Self-pay

## 2020-06-19 ENCOUNTER — Ambulatory Visit: Payer: 59 | Admitting: Physical Therapy

## 2020-06-19 ENCOUNTER — Telehealth: Payer: Self-pay | Admitting: Cardiology

## 2020-06-19 ENCOUNTER — Telehealth: Payer: Self-pay | Admitting: Medical

## 2020-06-19 ENCOUNTER — Encounter: Payer: Self-pay | Admitting: Physical Therapy

## 2020-06-19 VITALS — BP 102/77 | HR 67

## 2020-06-19 DIAGNOSIS — M25612 Stiffness of left shoulder, not elsewhere classified: Secondary | ICD-10-CM

## 2020-06-19 DIAGNOSIS — R29898 Other symptoms and signs involving the musculoskeletal system: Secondary | ICD-10-CM

## 2020-06-19 DIAGNOSIS — G8929 Other chronic pain: Secondary | ICD-10-CM

## 2020-06-19 DIAGNOSIS — M25512 Pain in left shoulder: Secondary | ICD-10-CM | POA: Diagnosis not present

## 2020-06-19 DIAGNOSIS — R293 Abnormal posture: Secondary | ICD-10-CM

## 2020-06-19 MED ORDER — BLOOD PRESSURE KIT: 1.0000 | PACK | Freq: Two times a day (BID) | 0 refills | Status: AC

## 2020-06-19 NOTE — Telephone Encounter (Signed)
Transferred call to Shonda.  

## 2020-06-19 NOTE — Telephone Encounter (Signed)
Unable to leave a VM due to full mailbox. Unable to [facility].

## 2020-06-19 NOTE — Telephone Encounter (Signed)
Patient came into office regarding a referral that was giving to him , for a urologist . Patient states he wasn't feeling well and wasn't able to set an appt . Patient would like to have another one put in . Please advise . I set up an appt with Saguier , not sure if he needs to be seen again before its put in.

## 2020-06-19 NOTE — Addendum Note (Signed)
Addended by: Eleonore Chiquito on: 06/19/2020 10:54 AM   Modules accepted: Orders

## 2020-06-19 NOTE — Therapy (Signed)
[facility] [address]  [address] [place], [place], [address] Phone: [phone]   Fax:  [phone]  Physical Therapy Treatment  Patient Details  Name: Matthew Gaines MRN: [id number] Date of Birth: 11-Jun-1961 Referring Provider (PT): Matthew Mesi, MD   Encounter Date: 06/19/2020   PT End of Session - 06/19/20 1148    Visit Number 2    Number of Visits 13    Date for PT Re-Evaluation 07/24/20    Authorization Type [employer]    Authorization - Visit Number 7    Authorization - Number of Visits 30    PT Start Time 1102    PT Stop Time 1144    PT Time Calculation (min) 42 min    Activity Tolerance Patient tolerated treatment well    Behavior During Therapy [facility] for tasks assessed/performed   somnolent          Past Medical History:  Diagnosis Date  . Allergy    Q 2-3 yrs has issues   . Arthritis   . Chest pain 04/25/2020  . Chest pain at rest 04/25/2020  . GERD (gastroesophageal reflux disease)    past hx   . Hyperlipidemia   . Overactive bladder   . Poor circulation     Past Surgical History:  Procedure Laterality Date  . CARDIAC CATHETERIZATION  1999  . CIRCUMCISION    . CORONARY ANGIOPLASTY WITH STENT PLACEMENT  1999  . LASER ABLATION OF VASCULAR LESION    . VASCULAR SURGERY      Vitals:   06/19/20 1124 06/19/20 1141  BP: 98/90 102/77  Pulse: 67   SpO2: 94%      Subjective Assessment - 06/19/20 1104    Subjective Has a CT scan on his heart on Friday. Still having chest pain intermittently, which is still being well-managed with nitroglycerin. Bringing in TENS unit today and requesting to have BP taken today. Denies issues with HEP.    Pertinent History vascular surgery, cardiac event 04/25/20    Diagnostic tests 04/25/20 chest xray: No active cardiopulmonary disease.    Patient Stated Goals play tennis and basketball    Currently in Pain? No/denies              [facility] PT Assessment -  06/19/20 0001      Observation/Other Assessments   Focus on Therapeutic Outcomes (FOTO)  Shoulder: 67 (34% limited, 33% predicted)                         [facility] Adult PT Treatment/Exercise - 06/19/20 0001      Exercises   Exercises Shoulder      Shoulder Exercises: Pulleys   Flexion 3 minutes    Scaption 3 minutes                  PT Education - 06/19/20 1147    Education Details instruction and demonstration of set up of TENS unit as well as edu on proper electrode placement and care; edu on normal BP values    Person(s) Educated Patient    Methods Explanation;Demonstration;Verbal cues    Comprehension Verbalized understanding;Returned demonstration            PT Short Term Goals - 06/19/20 1153      PT SHORT TERM GOAL #1   Title Patient to be independent with initial HEP.    Time 3    Period Weeks    Status  On-going    Target Date 07/03/20             PT Long Term Goals - 06/19/20 1153      PT LONG TERM GOAL #1   Title Patient to be independent with advanced HEP.    Time 6    Period Weeks    Status On-going      PT LONG TERM GOAL #2   Title Patient to demonstrate L shoulder strength >/=4+/5.    Time 6    Period Weeks    Status On-going      PT LONG TERM GOAL #3   Title Patient to demonstrate L shoulder AROM WFL and painless.    Time 6    Period Weeks    Status On-going      PT LONG TERM GOAL #4   Title Patient to report 80% improvement in pain with lifting household objects overhead.    Time 6    Period Weeks    Status On-going      PT LONG TERM GOAL #5   Title Patient to return to tennis and basketball with modifications as needed.    Time 6    Period Weeks    Status On-going                 Plan - 06/19/20 1152    Clinical Impression Statement Patient reports that he is scheduled for a heart CT on Friday. Does continue to report intermittent chest pain, but this is well-controlled with Nitroglycerin. Patient  requesting to check BP today as well as review of personal TENS unit use. Patient initially with slightly elevated diastolic BP, which decreased after sitting break. Patient was educated on normal BP values. Also instructed patient on proper use and set up of TENS unit. Patient reported understanding of all education provided today and without complaints at end of session.    Comorbidities vascular surgery, chest pain    PT Treatment/Interventions ADLs/Self Care Home Management;Cryotherapy;Electrical Stimulation;Iontophoresis 4mg /ml Dexamethasone;Moist Heat;Therapeutic exercise;Therapeutic activities;Functional mobility training;Ultrasound;Neuromuscular re-education;Patient/family education;Manual techniques;Vasopneumatic Device;Taping;Energy conservation;Dry needling;Passive range of motion    PT Next Visit Plan reassess HEP; periscapular strengthening and postural correction exercises    Consulted and Agree with Plan of Care Patient           Patient will benefit from skilled therapeutic intervention in order to improve the following deficits and impairments:  Increased edema, Decreased activity tolerance, Decreased strength, Increased fascial restricitons, Impaired UE functional use, Pain, Increased muscle spasms, Improper body mechanics, Decreased range of motion, Impaired flexibility, Postural dysfunction  Visit Diagnosis: Chronic left shoulder pain  Stiffness of left shoulder, not elsewhere classified  Abnormal posture  Other symptoms and signs involving the musculoskeletal system     Problem List Patient Active Problem List   Diagnosis Date Noted  . Angina pectoris (HCC) 06/15/2020  . Coronary artery disease involving native coronary artery of native heart with unstable angina pectoris (HCC) 05/16/2020  . Mixed hyperlipidemia 05/16/2020  . Chest pain at rest 04/25/2020  . Chest pain 04/25/2020     06/25/2020, PT, DPT 06/19/20 11:55 AM   [facility] [address]  [address] [place], [place], [place] Phone: [phone]   Fax:  [phone]  Name: Matthew Gaines MRN: [name] Date of Birth: [birth year]

## 2020-06-19 NOTE — Telephone Encounter (Signed)
Pt states that the rx for a bp cuff needs to be sent to [facility]

## 2020-06-20 ENCOUNTER — Ambulatory Visit (INDEPENDENT_AMBULATORY_CARE_PROVIDER_SITE_OTHER): Payer: 59 | Admitting: Medical

## 2020-06-20 VITALS — BP 118/76 | HR 72 | Temp 97.6°F | Resp 18 | Ht 73.0 in | Wt 148.4 lb

## 2020-06-20 DIAGNOSIS — I10 Essential (primary) hypertension: Secondary | ICD-10-CM | POA: Diagnosis not present

## 2020-06-20 DIAGNOSIS — R350 Frequency of micturition: Secondary | ICD-10-CM

## 2020-06-20 DIAGNOSIS — E785 Hyperlipidemia, unspecified: Secondary | ICD-10-CM | POA: Diagnosis not present

## 2020-06-20 DIAGNOSIS — M25519 Pain in unspecified shoulder: Secondary | ICD-10-CM

## 2020-06-20 DIAGNOSIS — H6122 Impacted cerumen, left ear: Secondary | ICD-10-CM

## 2020-06-20 DIAGNOSIS — N400 Enlarged prostate without lower urinary tract symptoms: Secondary | ICD-10-CM | POA: Diagnosis not present

## 2020-06-20 DIAGNOSIS — F419 Anxiety disorder, unspecified: Secondary | ICD-10-CM

## 2020-06-20 MED ORDER — BUSPIRONE HCL 7.5 MG PO TABS: 7.5000 mg | ORAL_TABLET | Freq: Two times a day (BID) | ORAL | 0 refills | Status: DC

## 2020-06-20 MED ORDER — TAMSULOSIN HCL 0.4 MG PO CAPS
0.4000 mg | ORAL_CAPSULE | Freq: Every day | ORAL | 3 refills | Status: DC
Start: 2020-06-20 — End: 2020-10-09

## 2020-06-20 NOTE — Patient Instructions (Addendum)
For urinary issues and bph rx flomax/generic.Sent rx to your pharmacy.  For high cholesterol follow thru with CT cardiac studies. Get lab today.  For htn continue bp med. Rx for electronic bp cuff. Check bp daily. Can due 3 consecutive readings and update [place] by early next week on readings.  For shoulder pain continue PT and follow up with specialist.  For stress/anxiety will rx buspar to take daily.  Keep appointments with vascular surgeon for varicose veins and GI appointments.  Advised use debrox otc stared next Thursday. Use daily and then follow up June 12 for attempt repeat lavage.  Follow up 2 weeks or as needed

## 2020-06-20 NOTE — Progress Notes (Signed)
Subjective:    Patient ID: Matthew Gaines, male    DOB: November 04, 1961, 59 y.o.   MRN: [id number]  HPI  Pt in today for urinary symptoms.    Pt states has seen urologist in the past. He states has hx of urgency, difficult urination and at times in past dribbling urination. He states had urologist in the past. He states in past saw urologist in [place]. Pt states in past he had bad uti, that caused psa to increase. He was on cipro and given medication as well. He states med similar to  [employer].   After lab review I had prescribed flomax. Pt used briefly but then he stopped. Pt was not aware of generic name.    Pt has hx of recent htn. When he checks bp his readings are varied.He give bp readings systolic 03-21. His bp cuff per his report does not give systolic readings. When checked today it did not give diastolic. Old machine. Pt is on metoprolol.   Pt has shoulder pain and getting PT. He may need surgery for rotator cuff.  Pt has varicose veins. Missed his appointment due to chest pain. Pt getting rescheduled to see vascular surgeon.  Pt getting rescheduled for his colonoscopy.   Pt scheduled for ct coronary for this Friday. Pt needs to get blood work done today. No current chest pain.     Review of Systems  Constitutional: Negative for chills, fatigue and fever.  Respiratory: Negative for cough, chest tightness, shortness of breath and wheezing.   Cardiovascular: Negative for chest pain and palpitations.  Gastrointestinal: Negative for abdominal pain and constipation.  Musculoskeletal: Negative for back pain.  Skin: Negative for rash.  Neurological: Negative for dizziness and headaches.  Hematological: Negative for adenopathy. Does not bruise/bleed easily.  Psychiatric/Behavioral: Negative for behavioral problems.    Past Medical History:  Diagnosis Date  . Allergy    Q 2-3 yrs has issues   . Arthritis   . Chest pain 04/25/2020  . Chest pain at rest 04/25/2020  . GERD  (gastroesophageal reflux disease)    past hx   . Hyperlipidemia   . Overactive bladder   . Poor circulation      Social History   Socioeconomic History  . Marital status: Married    Spouse name: Not on file  . Number of children: Not on file  . Years of education: Not on file  . Highest education level: Not on file  Occupational History  . Not on file  Tobacco Use  . Smoking status: Current Some Day Smoker    Types: Cigarettes  . Smokeless tobacco: Never Used  . Tobacco comment: 2-3 a day - some days none   Substance and Sexual Activity  . Alcohol use: Yes    Comment: occ   . Drug use: Never  . Sexual activity: Not on file  Other Topics Concern  . Not on file  Social History Narrative  . Not on file   Social Determinants of Health   Financial Resource Strain:   . Difficulty of Paying Living Expenses:   Food Insecurity:   . Worried About Charity fundraiser in the Last Year:   . Arboriculturist in the Last Year:   Transportation Needs:   . Film/video editor (Medical):   Marland Kitchen Lack of Transportation (Non-Medical):   Physical Activity:   . Days of Exercise per Week:   . Minutes of Exercise per Session:   Stress:   .  Feeling of Stress :   Social Connections:   . Frequency of Communication with Friends and Family:   . Frequency of Social Gatherings with Friends and Family:   . Attends Religious Services:   . Active Member of Clubs or Organizations:   . Attends Archivist Meetings:   Marland Kitchen Marital Status:   Intimate Partner Violence:   . Fear of Current or Ex-Partner:   . Emotionally Abused:   Marland Kitchen Physically Abused:   . Sexually Abused:     Past Surgical History:  Procedure Laterality Date  . CARDIAC CATHETERIZATION  1999  . CIRCUMCISION    . CORONARY ANGIOPLASTY WITH STENT PLACEMENT  1999  . LASER ABLATION OF VASCULAR LESION    . VASCULAR SURGERY      Family History  Problem Relation Age of Onset  . Lung cancer Mother   . Colon polyps Brother    . Colon cancer Neg Hx   . Esophageal cancer Neg Hx   . Rectal cancer Neg Hx   . Stomach cancer Neg Hx     No Known Allergies  Current Outpatient Medications on File Prior to Visit  Medication Sig Dispense Refill  . aspirin 81 MG chewable tablet Chew 81 mg by mouth daily.     . Blood Pressure KIT 1 kit by Does not apply route in the morning and at bedtime. 1 kit 0  . lidocaine (LIDODERM) 5 % Place 1 patch onto the skin daily. Remove & Discard patch within 12 hours or as directed by MD 30 patch 0  . Menthol-Methyl Salicylate (MUSCLE RUB) 10-15 % CREA Apply 1 application topically as needed for muscle pain.    . Multiple Vitamin (MULTIVITAMIN WITH MINERALS) TABS tablet Take 1 tablet by mouth daily.    . nitroGLYCERIN (NITROSTAT) 0.4 MG SL tablet Place 1 tablet (0.4 mg total) under the tongue every 5 (five) minutes as needed. 25 tablet 3  . Omega-3 Fatty Acids (FISH OIL) 1000 MG CAPS Take 1,000 mg by mouth daily.    . pantoprazole (PROTONIX) 40 MG tablet Take 1 tablet (40 mg total) by mouth daily. 30 tablet 0  . Hyprom-Naphaz-Polysorb-Zn Sulf (CLEAR EYES COMPLETE OP) Place 1 drop into both eyes daily as needed (for dry eyes). (Patient not taking: Reported on 06/20/2020)    . metoprolol tartrate (LOPRESSOR) 100 MG tablet Take 1 tablet (100 mg total) by mouth once for 1 dose. Take 2 hours prior to your CT if your heart rate is greater than 55 1 tablet 0   No current facility-administered medications on file prior to visit.    BP 118/76 (BP Location: Left Arm, Patient Position: Sitting, Cuff Size: Normal)   Pulse 72   Temp 97.6 F (36.4 C) (Temporal)   Resp 18   Ht _0  (1.854 m)   Wt 148 lb 6.4 oz (67.3 kg)   SpO2 98%   BMI 19.58 kg/m       Objective:   Physical Exam  General Mental Status- Alert. General Appearance- Not in acute distress.   Skin General: Color- Normal Color. Moisture- Normal Moisture.  Neck Carotid Arteries- Normal color. Moisture- Normal Moisture. No  carotid bruits. No JVD.  Chest and Lung Exam Auscultation: Breath Sounds:-Normal.  Cardiovascular Auscultation:Rythm- Regular. Murmurs & Other Heart Sounds:Auscultation of the heart reveals- No Murmurs.  Abdomen Inspection:-Inspeection Normal. Palpation/Percussion:Note:No mass. Palpation and Percussion of the abdomen reveal- Non Tender, Non Distended + BS, no rebound or guarding.   Neurologic Cranial Nerve exam:-  CN III-XII intact(No nystagmus), symmetric smile. Strength:- 5/5 equal and symmetric strength both upper and lower extremities.  heent- left ear blocked with cerumen. Post lavage wax not clerared Rt side- no wax obstruction.    Assessment & Plan:  For urinary issues and bph rx flomax/generic.Sent rx to your pharmacy.  For high cholesterol follow thru with CT cardiac studies. Get lab today.  For htn continue bp med. Rx for electronic bp cuff. Check bp daily. Can due 3 consective readings and update [place] by early next week on readings.  For shoulder pain continue PT and follow up with specialist.  For stress/anxiety will rx buspar to take daily.  Advised use debrox otc stared next Thursday. Use daily and then follow up June 12 for attempt repeat lavage.   Follow up 2 weeks or as needed  Mackie Pai, PA-C   Time spent with patient today was  40 minutes which consisted of chart review, discussing diagnoses,  Treatment referral, ear lavage  and documentation.

## 2020-06-21 LAB — BASIC METABOLIC PANEL
BUN/Creatinine Ratio: 18 (ref 10–24)
BUN: 18 mg/dL (ref 8–27)
CO2: 28 mmol/L (ref 20–29)
Calcium: 9.8 mg/dL (ref 8.6–10.2)
Chloride: 101 mmol/L (ref 96–106)
Creatinine, Ser: 0.98 mg/dL (ref 0.76–1.27)
GFR calc Af Amer: 96 mL/min/{1.73_m2} (ref >59)
GFR calc non Af Amer: 83 mL/min/{1.73_m2} (ref >59)
Glucose: 74 mg/dL (ref 65–99)
Potassium: 4.8 mmol/L (ref 3.5–5.2)
Sodium: 141 mmol/L (ref 134–144)

## 2020-06-22 ENCOUNTER — Encounter: Payer: Self-pay | Admitting: Physical Therapy

## 2020-06-22 ENCOUNTER — Ambulatory Visit: Payer: 59 | Attending: Family Medicine | Admitting: Physical Therapy

## 2020-06-22 ENCOUNTER — Other Ambulatory Visit: Payer: Self-pay

## 2020-06-22 DIAGNOSIS — R29898 Other symptoms and signs involving the musculoskeletal system: Secondary | ICD-10-CM | POA: Diagnosis present

## 2020-06-22 DIAGNOSIS — R293 Abnormal posture: Secondary | ICD-10-CM | POA: Insufficient documentation

## 2020-06-22 DIAGNOSIS — M25512 Pain in left shoulder: Secondary | ICD-10-CM | POA: Diagnosis not present

## 2020-06-22 DIAGNOSIS — M25612 Stiffness of left shoulder, not elsewhere classified: Secondary | ICD-10-CM

## 2020-06-22 DIAGNOSIS — G8929 Other chronic pain: Secondary | ICD-10-CM | POA: Diagnosis present

## 2020-06-22 NOTE — Therapy (Signed)
[facility] [address]  [address] [place], [place], [address] Phone: [phone]   Fax:  [phone]  Physical Therapy Treatment  Patient Details  Name: Matthew Gaines MRN: [id number] Date of Birth: [birth year] Referring Provider (PT): Lavada Mesi, MD   Encounter Date: 06/22/2020   PT End of Session - 06/22/20 1104    Visit Number 3    Number of Visits 13    Date for PT Re-Evaluation 07/24/20    Authorization Type [employer]    Authorization - Visit Number 8    Authorization - Number of Visits 30    PT Start Time 1022    PT Stop Time 1102    PT Time Calculation (min) 40 min    Activity Tolerance Patient tolerated treatment well;Patient limited by lethargy    Behavior During Therapy [facility] for tasks assessed/performed   somnolent          Past Medical History:  Diagnosis Date  . Allergy    Q 2-3 yrs has issues   . Arthritis   . Chest pain 04/25/2020  . Chest pain at rest 04/25/2020  . GERD (gastroesophageal reflux disease)    past hx   . Hyperlipidemia   . Overactive bladder   . Poor circulation     Past Surgical History:  Procedure Laterality Date  . CARDIAC CATHETERIZATION  1999  . CIRCUMCISION    . CORONARY ANGIOPLASTY WITH STENT PLACEMENT  1999  . LASER ABLATION OF VASCULAR LESION    . VASCULAR SURGERY      There were no vitals filed for this visit.   Subjective Assessment - 06/22/20 1024    Subjective Not doing too bad today. Has not attempted to use his TENs unit yet.    Pertinent History vascular surgery, cardiac event 04/25/20    Diagnostic tests 04/25/20 chest xray: No active cardiopulmonary disease.    Patient Stated Goals play tennis and basketball    Currently in Pain? No/denies                             [facility] Adult PT Treatment/Exercise - 06/22/20 0001      Shoulder Exercises: Seated   External Rotation AAROM;Both;10 reps    External Rotation Limitations UE elevated  on orange pball 10x3"    Flexion AAROM;Left;12 reps    Flexion Limitations with wand to tolerance   cues to avoid straining neck to increase ROM     Shoulder Exercises: Standing   External Rotation Strengthening;Left;10 reps;Theraband    Theraband Level (Shoulder External Rotation) Level 2 (Red)    External Rotation Limitations isometric step outs; cues to maintain neutral shoulder    Internal Rotation Strengthening;Left;10 reps;Theraband    Theraband Level (Shoulder Internal Rotation) Level 2 (Red)    Internal Rotation Limitations isometric step outs; cues to maintain neutral shoulder    Extension Strengthening;Both;10 reps;Theraband    Theraband Level (Shoulder Extension) Level 2 (Red)    Extension Limitations cues to maintain elbow straight    Row Strengthening;Both;10 reps;Theraband    Theraband Level (Shoulder Row) Level 2 (Red)    Row Limitations cues to avoid over-extending shoulders      Shoulder Exercises: ROM/Strengthening   UBE (Upper Arm Bike) L1 x 3 min forward, 3 min back   rest break in between d/t fatigue     [employer]   Create Space 3 strip star pattern over L AC joint  with 50% stretch                  PT Education - 06/22/20 1103    Education Details stressed importance of HEP compliance for max benefit; reminded patient of KT tape wear time and precautions    Person(s) Educated Patient    Methods Explanation;Demonstration;Tactile cues;Verbal cues;Handout    Comprehension Verbalized understanding;Returned demonstration            PT Short Term Goals - 06/19/20 1153      PT SHORT TERM GOAL #1   Title Patient to be independent with initial HEP.    Time 3    Period Weeks    Status On-going    Target Date 07/03/20             PT Long Term Goals - 06/19/20 1153      PT LONG TERM GOAL #1   Title Patient to be independent with advanced HEP.    Time 6    Period Weeks    Status On-going      PT LONG TERM GOAL #2   Title Patient to  demonstrate L shoulder strength >/=4+/5.    Time 6    Period Weeks    Status On-going      PT LONG TERM GOAL #3   Title Patient to demonstrate L shoulder AROM WFL and painless.    Time 6    Period Weeks    Status On-going      PT LONG TERM GOAL #4   Title Patient to report 80% improvement in pain with lifting household objects overhead.    Time 6    Period Weeks    Status On-going      PT LONG TERM GOAL #5   Title Patient to return to tennis and basketball with modifications as needed.    Time 6    Period Weeks    Status On-going                 Plan - 06/22/20 1105    Clinical Impression Statement Patient without new complaints today, but still appearing drowsy. Reviewed HEP for max carryover, with patient requiring corrective cues for proper form. Did demonstrate improved tolerance for L shoulder ER AAROM with physioball support compared to stretch at the door which was previously poorly-tolerated. Patient requested a break mid-session to take a dose of Buspirone but did not mention experiencing any particular symptoms. Worked on periscapular strengthening with patient noting some irritation at the L shoulder. Ended session with KT tape to L AC joint for pain relief. Stressed importance of HEP compliance for max benefit as patient admits to noncompliance at home. No further complaints at end of session.    Comorbidities vascular surgery, chest pain    PT Treatment/Interventions ADLs/Self Care Home Management;Cryotherapy;Electrical Stimulation;Iontophoresis 4mg /ml Dexamethasone;Moist Heat;Therapeutic exercise;Therapeutic activities;Functional mobility training;Ultrasound;Neuromuscular re-education;Patient/family education;Manual techniques;Vasopneumatic Device;Taping;Energy conservation;Dry needling;Passive range of motion    PT Next Visit Plan periscapular strengthening and postural correction exercises    Consulted and Agree with Plan of Care Patient           Patient  will benefit from skilled therapeutic intervention in order to improve the following deficits and impairments:  Increased edema, Decreased activity tolerance, Decreased strength, Increased fascial restricitons, Impaired UE functional use, Pain, Increased muscle spasms, Improper body mechanics, Decreased range of motion, Impaired flexibility, Postural dysfunction  Visit Diagnosis: Chronic left shoulder pain  Stiffness of left shoulder, not elsewhere classified  Abnormal  posture  Other symptoms and signs involving the musculoskeletal system     Problem List Patient Active Problem List   Diagnosis Date Noted  . Angina pectoris (HCC) 06/15/2020  . Coronary artery disease involving native coronary artery of native heart with unstable angina pectoris (HCC) 05/16/2020  . Mixed hyperlipidemia 05/16/2020  . Chest pain at rest 04/25/2020  . Chest pain 04/25/2020     Anette Guarneri, PT, DPT 06/22/20 11:12 AM   [facility] [address]  [address] [place], [place], [address] Phone: [phone]   Fax:  [phone]  Name: Matthew Gaines MRN: [id number] Date of Birth: 1961/03/21

## 2020-06-26 ENCOUNTER — Ambulatory Visit: Payer: 59 | Admitting: Physical Therapy

## 2020-06-28 ENCOUNTER — Encounter: Payer: Self-pay | Admitting: Physical Therapy

## 2020-06-28 ENCOUNTER — Ambulatory Visit: Payer: 59 | Admitting: Physical Therapy

## 2020-06-28 ENCOUNTER — Other Ambulatory Visit: Payer: Self-pay

## 2020-06-28 DIAGNOSIS — M25512 Pain in left shoulder: Secondary | ICD-10-CM

## 2020-06-28 DIAGNOSIS — G8929 Other chronic pain: Secondary | ICD-10-CM

## 2020-06-28 DIAGNOSIS — M25612 Stiffness of left shoulder, not elsewhere classified: Secondary | ICD-10-CM

## 2020-06-28 DIAGNOSIS — R293 Abnormal posture: Secondary | ICD-10-CM

## 2020-06-28 DIAGNOSIS — R29898 Other symptoms and signs involving the musculoskeletal system: Secondary | ICD-10-CM

## 2020-06-28 NOTE — Therapy (Signed)
[facility] [address]  [address] [place], [place], [address] Phone: [phone]   Fax:  [phone]  Physical Therapy Treatment  Patient Details  Name: Matthew Gaines MRN: [id number] Date of Birth: [birth year] Referring Provider (PT): Lavada Mesi, MD   Encounter Date: 06/28/2020   PT End of Session - 06/28/20 1143    Visit Number 4    Number of Visits 13    Date for PT Re-Evaluation 07/24/20    Authorization Type [employer]    Authorization - Visit Number 9    Authorization - Number of Visits 30    PT Start Time 1059    PT Stop Time 1140    PT Time Calculation (min) 41 min    Activity Tolerance Patient tolerated treatment well    Behavior During Therapy [facility] for tasks assessed/performed   somnolent          Past Medical History:  Diagnosis Date  . Allergy    Q 2-3 yrs has issues   . Arthritis   . Chest pain 04/25/2020  . Chest pain at rest 04/25/2020  . GERD (gastroesophageal reflux disease)    past hx   . Hyperlipidemia   . Overactive bladder   . Poor circulation     Past Surgical History:  Procedure Laterality Date  . CARDIAC CATHETERIZATION  1999  . CIRCUMCISION    . CORONARY ANGIOPLASTY WITH STENT PLACEMENT  1999  . LASER ABLATION OF VASCULAR LESION    . VASCULAR SURGERY      There were no vitals filed for this visit.   Subjective Assessment - 06/28/20 1100    Subjective "Doing a whole lot better than the other day." Had episodes of short duration sharp chest pain on Tuesday which is why he did not attend PT that day. Chest pain resolved with meds. Did 75 push ups yesterday which caused some shoulder pain. Has not taken the tape off his shoulder since last session.    Pertinent History vascular surgery, cardiac event 04/25/20    Diagnostic tests 04/25/20 chest xray: No active cardiopulmonary disease.    Patient Stated Goals play tennis and basketball    Currently in Pain? No/denies                              [facility] Adult PT Treatment/Exercise - 06/28/20 0001      Shoulder Exercises: Supine   Horizontal ABduction Strengthening;Both;10 reps;Theraband    Theraband Level (Shoulder Horizontal ABduction) Level 2 (Red)    Horizontal ABduction Limitations cueing to increase ROM on L UE    External Rotation Strengthening;Both;10 reps;Theraband    Theraband Level (Shoulder External Rotation) Level 2 (Red)    External Rotation Limitations towel roll under L elbow to maintain neutral position    Flexion Strengthening;Left;10 reps;Weights    Shoulder Flexion Weight (lbs) 1    Flexion Limitations cues to avoid pushing into pain      Shoulder Exercises: Seated   External Rotation AAROM;Both;10 reps    External Rotation Limitations UE elevated on orange pball 10x3"   assist to keep ball stabilized     Shoulder Exercises: ROM/Strengthening   UBE (Upper Arm Bike) L1 x 3 min forward, 3 min back    Lat Pull Limitations wide grip 2x10 25#   cues to depress shoulders and decrease speed   Cybex Row Limitations narrow grip; 2x10 25#   cues to  avoid over-extending shoulders past torso                   PT Short Term Goals - 06/19/20 1153      PT SHORT TERM GOAL #1   Title Patient to be independent with initial HEP.    Time 3    Period Weeks    Status On-going    Target Date 07/03/20             PT Long Term Goals - 06/19/20 1153      PT LONG TERM GOAL #1   Title Patient to be independent with advanced HEP.    Time 6    Period Weeks    Status On-going      PT LONG TERM GOAL #2   Title Patient to demonstrate L shoulder strength >/=4+/5.    Time 6    Period Weeks    Status On-going      PT LONG TERM GOAL #3   Title Patient to demonstrate L shoulder AROM WFL and painless.    Time 6    Period Weeks    Status On-going      PT LONG TERM GOAL #4   Title Patient to report 80% improvement in pain with lifting household objects overhead.    Time 6     Period Weeks    Status On-going      PT LONG TERM GOAL #5   Title Patient to return to tennis and basketball with modifications as needed.    Time 6    Period Weeks    Status On-going                 Plan - 06/28/20 1144    Clinical Impression Statement Patient looking more awake today and taking several medications at start of session. Noting that he is feeling better compared to Tuesday, when he had several bouts of chest pain which were resolved by meds. Today patient is asymptomatic. Reports that he did 75 pushups yesterday which caused some shoulder pain- advised patient to start at a lower number of reps to assess for tolerance and avoiding an exacerbation of pain. Patient reported understanding. Removed KT tape from L shoulder today to ensure there was no skin irritation. Patient tolerated all shoulder strengthening and stretching mat ther-ex without complaints and with good muscle control. Initiated Therapist, art with patient demonstrating good tolerance and carryover of cueing for form. Patient reported no pain at end of session, thus modalities deferred.    Comorbidities vascular surgery, chest pain    PT Treatment/Interventions ADLs/Self Care Home Management;Cryotherapy;Electrical Stimulation;Iontophoresis 4mg /ml Dexamethasone;Moist Heat;Therapeutic exercise;Therapeutic activities;Functional mobility training;Ultrasound;Neuromuscular re-education;Patient/family education;Manual techniques;Vasopneumatic Device;Taping;Energy conservation;Dry needling;Passive range of motion    PT Next Visit Plan periscapular strengthening and postural correction exercises    Consulted and Agree with Plan of Care Patient           Patient will benefit from skilled therapeutic intervention in order to improve the following deficits and impairments:  Increased edema, Decreased activity tolerance, Decreased strength, Increased fascial restricitons, Impaired UE functional use, Pain, Increased  muscle spasms, Improper body mechanics, Decreased range of motion, Impaired flexibility, Postural dysfunction  Visit Diagnosis: Chronic left shoulder pain  Stiffness of left shoulder, not elsewhere classified  Abnormal posture  Other symptoms and signs involving the musculoskeletal system     Problem List Patient Active Problem List   Diagnosis Date Noted  . Angina pectoris (HCC) 06/15/2020  . Coronary artery disease  involving native coronary artery of native heart with unstable angina pectoris (HCC) 05/16/2020  . Mixed hyperlipidemia 05/16/2020  . Chest pain at rest 04/25/2020  . Chest pain 04/25/2020     Anette Guarneri, PT, DPT 06/28/20 11:55 AM   [facility] [address]  [address] [place], [place], [address] Phone: [phone]   Fax:  [phone]  Name: Myking Sar MRN: [id number] Date of Birth: [birth year]

## 2020-07-02 ENCOUNTER — Other Ambulatory Visit: Payer: Self-pay

## 2020-07-02 ENCOUNTER — Encounter: Payer: 59 | Admitting: Medical

## 2020-07-03 ENCOUNTER — Encounter: Payer: Self-pay | Admitting: Physical Therapy

## 2020-07-03 ENCOUNTER — Ambulatory Visit: Payer: 59 | Admitting: Physical Therapy

## 2020-07-03 VITALS — BP 100/70 | HR 62

## 2020-07-03 DIAGNOSIS — R293 Abnormal posture: Secondary | ICD-10-CM

## 2020-07-03 DIAGNOSIS — M25612 Stiffness of left shoulder, not elsewhere classified: Secondary | ICD-10-CM

## 2020-07-03 DIAGNOSIS — R29898 Other symptoms and signs involving the musculoskeletal system: Secondary | ICD-10-CM

## 2020-07-03 DIAGNOSIS — M25512 Pain in left shoulder: Secondary | ICD-10-CM | POA: Diagnosis not present

## 2020-07-03 DIAGNOSIS — G8929 Other chronic pain: Secondary | ICD-10-CM

## 2020-07-03 NOTE — Therapy (Signed)
[facility] Outpatient Rehabilitation [facility] [address]  [address] [place], [place], [address] Phone: [phone]   Fax:  [phone]  Physical Therapy Treatment  Patient Details  Name: Matthew Gaines MRN: [id number] Date of Birth: [birth year] Referring Provider (PT): Lavada Mesi, MD   Encounter Date: 07/03/2020   PT End of Session - 07/03/20 1144    Visit Number 5    Number of Visits 13    Date for PT Re-Evaluation 07/24/20    Authorization Type [employer]    Authorization - Visit Number 10    Authorization - Number of Visits 30    PT Start Time 1057    PT Stop Time 1155    PT Time Calculation (min) 58 min    Activity Tolerance Patient tolerated treatment well;Patient limited by pain    Behavior During Therapy [facility] for tasks assessed/performed;Flat affect   somnolent          Past Medical History:  Diagnosis Date  . Allergy    Q 2-3 yrs has issues   . Arthritis   . Chest pain 04/25/2020  . Chest pain at rest 04/25/2020  . GERD (gastroesophageal reflux disease)    past hx   . Hyperlipidemia   . Overactive bladder   . Poor circulation     Past Surgical History:  Procedure Laterality Date  . CARDIAC CATHETERIZATION  1999  . CIRCUMCISION    . CORONARY ANGIOPLASTY WITH STENT PLACEMENT  1999  . LASER ABLATION OF VASCULAR LESION    . VASCULAR SURGERY      Vitals:   07/03/20 1057  BP: 100/70  Pulse: 62  SpO2: 100%     Subjective Assessment - 07/03/20 1057    Subjective Doing pretty well- notes that he has been working on HEP but has not done as much pushups as was advised previously. Shoulder seems to be a little aggravated from the way he slept. Requesting to get BP reading done today.    Pertinent History vascular surgery, cardiac event 04/25/20    Diagnostic tests 04/25/20 chest xray: No active cardiopulmonary disease.    Patient Stated Goals play tennis and basketball    Currently in Pain? Yes    Pain Score 5     Pain Location  Shoulder    Pain Orientation Left    Pain Descriptors / Indicators Dull    Pain Type Acute pain                             [facility] Adult PT Treatment/Exercise - 07/03/20 0001      Neck Exercises: Machines for Strengthening   UBE (Upper Arm Bike) Lvl 1.5, 6 min forward       Shoulder Exercises: Seated   External Rotation AAROM;10 reps;Left    External Rotation Limitations with wand to tolerance   cues to avoid pushing into pain- pt with pain on return IR     Shoulder Exercises: Standing   External Rotation Strengthening    Theraband Level (Shoulder External Rotation) Level 3 (Green)    External Rotation Limitations cueing to stand more upright   slow speed   Internal Rotation Strengthening;Left;10 reps;Theraband    Theraband Level (Shoulder Internal Rotation) Level 3 (Green)    Internal Rotation Limitations cueing to stand more upright   slow speed   Row Strengthening;Both;10 reps;Theraband    Theraband Level (Shoulder Row) Level 3 (Green)    Row Limitations  cues to avoid forard head posture      Shoulder Exercises: ROM/Strengthening   UBE (Upper Arm Bike) L2 x 3 min forward, 3 min back      Shoulder Exercises: Stretch   Corner Stretch 1 rep;30 seconds    Corner Stretch Limitations L shoulder pec stretch attempted at 90 and 60 deg   discontonued d/t pain   Other Shoulder Stretches L shoulder IR/ER stretch with strap 5x5" to tolerance   cues to avoid pushing into pain     Moist Heat Therapy   Number Minutes Moist Heat 15 Minutes    Moist Heat Location Shoulder   L     Electrical Stimulation   Electrical Stimulation Location R shoulder complex    Electrical Stimulation Action IFC    Electrical Stimulation Parameters 80-150hz , output to tolerance; 15 min    Electrical Stimulation Goals Pain                    PT Short Term Goals - 06/19/20 1153      PT SHORT TERM GOAL #1   Title Patient to be independent with initial HEP.    Time 3    Period  Weeks    Status On-going    Target Date 07/03/20             PT Long Term Goals - 06/19/20 1153      PT LONG TERM GOAL #1   Title Patient to be independent with advanced HEP.    Time 6    Period Weeks    Status On-going      PT LONG TERM GOAL #2   Title Patient to demonstrate L shoulder strength >/=4+/5.    Time 6    Period Weeks    Status On-going      PT LONG TERM GOAL #3   Title Patient to demonstrate L shoulder AROM WFL and painless.    Time 6    Period Weeks    Status On-going      PT LONG TERM GOAL #4   Title Patient to report 80% improvement in pain with lifting household objects overhead.    Time 6    Period Weeks    Status On-going      PT LONG TERM GOAL #5   Title Patient to return to tennis and basketball with modifications as needed.    Time 6    Period Weeks    Status On-going                 Plan - 07/03/20 1145    Clinical Impression Statement Patient reporting slight aggravation in L shoulder without known cause. Requesting BP reading at start of session which were both WNL.  Patient performed shoulder IR and ER stretching to tolerance, with most limitation and pain today demonstrated with IR. Slightly better tolerance for stretching with strap vs. wand today. Stopped mid-session as patient reported that he must take one of his medications. Proceeded with session, attempting pec stretch at different angles to find comfort, however this was discontinued d/t pain. Banded periscapular and RTC strengthening was perform with slow speed and difficulty, but patient agreeable to continue. D/t pain levels, ended session with e-stim and moist heat to L shoulder. Patient reported some improvement in pain at end of session and without further complaints at end of session.    Comorbidities vascular surgery, chest pain    PT Treatment/Interventions ADLs/Self Care Home Management;Cryotherapy;Electrical Stimulation;Iontophoresis 4mg /ml Dexamethasone;Moist  Heat;Therapeutic exercise;Therapeutic activities;Functional mobility training;Ultrasound;Neuromuscular re-education;Patient/family education;Manual techniques;Vasopneumatic Device;Taping;Energy conservation;Dry needling;Passive range of motion    PT Next Visit Plan periscapular strengthening and postural correction exercises    Consulted and Agree with Plan of Care Patient           Patient will benefit from skilled therapeutic intervention in order to improve the following deficits and impairments:  Increased edema, Decreased activity tolerance, Decreased strength, Increased fascial restricitons, Impaired UE functional use, Pain, Increased muscle spasms, Improper body mechanics, Decreased range of motion, Impaired flexibility, Postural dysfunction  Visit Diagnosis: Chronic left shoulder pain  Stiffness of left shoulder, not elsewhere classified  Abnormal posture  Other symptoms and signs involving the musculoskeletal system     Problem List Patient Active Problem List   Diagnosis Date Noted  . Angina pectoris (HCC) 06/15/2020  . Coronary artery disease involving native coronary artery of native heart with unstable angina pectoris (HCC) 05/16/2020  . Mixed hyperlipidemia 05/16/2020  . Chest pain at rest 04/25/2020  . Chest pain 04/25/2020     Anette Guarneri, PT, DPT 07/03/20 12:01 PM   [facility] [address]  [address] [place], [place], [address] Phone: [phone]   Fax:  [phone]  Name: Leeman Johnsey MRN: [id number] Date of Birth: 12-14-1961

## 2020-07-04 ENCOUNTER — Ambulatory Visit: Payer: 59 | Admitting: Medical

## 2020-07-04 DIAGNOSIS — Z0289 Encounter for other administrative examinations: Secondary | ICD-10-CM

## 2020-07-06 ENCOUNTER — Ambulatory Visit: Payer: 59

## 2020-07-09 ENCOUNTER — Other Ambulatory Visit: Payer: Self-pay

## 2020-07-09 ENCOUNTER — Ambulatory Visit (INDEPENDENT_AMBULATORY_CARE_PROVIDER_SITE_OTHER): Payer: 59 | Admitting: Medical

## 2020-07-09 VITALS — BP 120/80 | HR 88 | Resp 18 | Ht 73.0 in | Wt 154.0 lb

## 2020-07-09 DIAGNOSIS — I10 Essential (primary) hypertension: Secondary | ICD-10-CM | POA: Diagnosis not present

## 2020-07-09 DIAGNOSIS — N400 Enlarged prostate without lower urinary tract symptoms: Secondary | ICD-10-CM

## 2020-07-09 DIAGNOSIS — F419 Anxiety disorder, unspecified: Secondary | ICD-10-CM

## 2020-07-09 DIAGNOSIS — M25519 Pain in unspecified shoulder: Secondary | ICD-10-CM | POA: Diagnosis not present

## 2020-07-09 MED ORDER — BUSPIRONE HCL 7.5 MG PO TABS: 7.5000 mg | ORAL_TABLET | Freq: Two times a day (BID) | ORAL | 5 refills | Status: DC

## 2020-07-09 NOTE — Progress Notes (Signed)
Subjective:    Patient ID: Matthew Gaines, male    DOB: 1961-03-04, 59 y.o.   MRN: [id number]  HPI  Pt in for follow up.  Pt states achiness to joints. Typically in past achiness with rain.  Pt is considering getting disability. He has hired a Chief Executive Officer.  Pt some teeth issues related to chipped teeth and wisdom. Pt has insurance issues and appointment pending with [facility] on Wed am.   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 He had six procedures on legs in the past. He has upcoming appointment with new vascular surgeon office soon. Venous reflux studies on July 22nd. Appointment with specialist as well.   Pt has hx of probable bph. Normal psa. flomax has helped a little but not completely. Pt still feels like has to urinate after going.  PT is helping pt shoulder. TENS unit helping  Pt has hx of anxiety recently with family issues and constant stress. I had written buspar. It seems to help a lot.    Review of Systems  Constitutional: Negative for chills, fatigue and fever.  HENT: Negative for congestion.        Teeth issues/pain. See hpi.  Respiratory: Negative for chest tightness, shortness of breath and wheezing.   Cardiovascular: Negative for chest pain and palpitations.  Gastrointestinal: Negative for abdominal pain, constipation, nausea and vomiting.  Genitourinary:       See hpi.  Musculoskeletal: Negative for back pain.  Skin: Negative for rash.  Neurological: Negative for dizziness, speech difficulty, weakness and headaches.  Hematological: Negative for adenopathy. Does not bruise/bleed easily.  Psychiatric/Behavioral: Negative for behavioral problems, decreased concentration, dysphoric mood, sleep disturbance and suicidal ideas. The patient is nervous/anxious.        Much improved with buspar.    Past Medical History:  Diagnosis Date  . Allergy    Q 2-3 yrs has issues   . Arthritis   . Chest pain 04/25/2020  . Chest pain at rest 04/25/2020  . GERD  (gastroesophageal reflux disease)    past hx   . Hyperlipidemia   . Overactive bladder   . Poor circulation      Social History   Socioeconomic History  . Marital status: Married    Spouse name: Not on file  . Number of children: Not on file  . Years of education: Not on file  . Highest education level: Not on file  Occupational History  . Not on file  Tobacco Use  . Smoking status: Current Some Day Smoker    Types: Cigarettes  . Smokeless tobacco: Never Used  . Tobacco comment: 2-3 a day - some days none   Substance and Sexual Activity  . Alcohol use: Yes    Comment: occ   . Drug use: Never  . Sexual activity: Not on file  Other Topics Concern  . Not on file  Social History Narrative  . Not on file   Social Determinants of Health   Financial Resource Strain:   . Difficulty of Paying Living Expenses:   Food Insecurity:   . Worried About Charity fundraiser in the Last Year:   . Arboriculturist in the Last Year:   Transportation Needs:   . Film/video editor (Medical):   Marland Kitchen Lack of Transportation (Non-Medical):   Physical Activity:   . Days of Exercise per Week:   . Minutes of Exercise per Session:   Stress:   . Feeling of Stress :  Social Connections:   . Frequency of Communication with Friends and Family:   . Frequency of Social Gatherings with Friends and Family:   . Attends Religious Services:   . Active Member of Clubs or Organizations:   . Attends Archivist Meetings:   Marland Kitchen Marital Status:   Intimate Partner Violence:   . Fear of Current or Ex-Partner:   . Emotionally Abused:   Marland Kitchen Physically Abused:   . Sexually Abused:     Past Surgical History:  Procedure Laterality Date  . CARDIAC CATHETERIZATION  1999  . CIRCUMCISION    . CORONARY ANGIOPLASTY WITH STENT PLACEMENT  1999  . LASER ABLATION OF VASCULAR LESION    . VASCULAR SURGERY      Family History  Problem Relation Age of Onset  . Lung cancer Mother   . Colon polyps Brother    . Colon cancer Neg Hx   . Esophageal cancer Neg Hx   . Rectal cancer Neg Hx   . Stomach cancer Neg Hx     No Known Allergies  Current Outpatient Medications on File Prior to Visit  Medication Sig Dispense Refill  . aspirin 81 MG chewable tablet Chew 81 mg by mouth daily.     . Blood Pressure KIT 1 kit by Does not apply route in the morning and at bedtime. 1 kit 0  . busPIRone (BUSPAR) 7.5 MG tablet Take 1 tablet (7.5 mg total) by mouth 2 (two) times daily. 60 tablet 0  . Hyprom-Naphaz-Polysorb-Zn Sulf (CLEAR EYES COMPLETE OP) Place 1 drop into both eyes daily as needed (for dry eyes).     Marland Kitchen lidocaine (LIDODERM) 5 % Place 1 patch onto the skin daily. Remove & Discard patch within 12 hours or as directed by MD 30 patch 0  . Menthol-Methyl Salicylate (MUSCLE RUB) 10-15 % CREA Apply 1 application topically as needed for muscle pain.    . Multiple Vitamin (MULTIVITAMIN WITH MINERALS) TABS tablet Take 1 tablet by mouth daily.    . nitroGLYCERIN (NITROSTAT) 0.4 MG SL tablet Place 1 tablet (0.4 mg total) under the tongue every 5 (five) minutes as needed. 25 tablet 3  . Omega-3 Fatty Acids (FISH OIL) 1000 MG CAPS Take 1,000 mg by mouth daily.    . pantoprazole (PROTONIX) 40 MG tablet Take 1 tablet (40 mg total) by mouth daily. 30 tablet 0  . tamsulosin (FLOMAX) 0.4 MG CAPS capsule Take 1 capsule (0.4 mg total) by mouth daily. 30 capsule 3  . metoprolol tartrate (LOPRESSOR) 100 MG tablet Take 1 tablet (100 mg total) by mouth once for 1 dose. Take 2 hours prior to your CT if your heart rate is greater than 55 1 tablet 0   No current facility-administered medications on file prior to visit.    BP 120/80 (BP Location: Left Arm, Patient Position: Sitting, Cuff Size: Large)   Pulse 88   Resp 18   Ht _0  (1.854 m)   Wt 154 lb (69.9 kg)   SpO2 98%   BMI 20.32 kg/m       Objective:   Physical Exam  General Mental Status- Alert. General Appearance- Not in acute distress.   Skin General:  Color- Normal Color. Moisture- Normal Moisture.  Neck Carotid Arteries- Normal color. Moisture- Normal Moisture. No carotid bruits. No JVD.  Chest and Lung Exam Auscultation: Breath Sounds:-Normal.  Cardiovascular Auscultation:Rythm- Regular. Murmurs & Other Heart Sounds:Auscultation of the heart reveals- No Murmurs.  Abdomen Inspection:-Inspeection Normal. Palpation/Percussion:Note:No mass. Palpation and  Percussion of the abdomen reveal- Non Tender, Non Distended + BS, no rebound or guarding.   Neurologic Cranial Nerve exam:- CN III-XII intact(No nystagmus), symmetric smile. Strength:- 5/5 equal and symmetric strength both upper and lower extremities.  Legs- obvious dilated varicose veins on both legs. No assymetry.    Assessment & Plan:  Glad to hear your anxiety is well controlled presently. Continue buspar. Let me know if anxiety is worsening.  Your bp is well controlled. Continue current med regimen.  For left shoulder pain history continue PT.  For anxiety continue buspar.   For bph and residual urinary symptoms that persist refer to urologist.  For varicose vein history keep vascular MD appointment.  For tooth pain/caries see dentist on Wednesday.  Mackie Pai, PA-C   Time spent with patient today was  30 minutes which consisted of chart review, discussing diagnosis, work up, treatment and documentation.

## 2020-07-09 NOTE — Patient Instructions (Addendum)
Glad to hear your anxiety is well controlled presently. Continue buspar. Let me know if anxiety is worsening.  Your bp is well controlled. Continue current med regimen.  For left shoulder pain history continue PT.  For anxiety continue buspar.   For bph and residual urinary symptoms that persist refer to urologist.  For varicose vein history keep vascular MD appointment.  For tooth pain/caries see dentist on Wednesday. (dental works [phone]) back up dentist if needed  Follow up 1 month or as needed.

## 2020-07-10 ENCOUNTER — Ambulatory Visit: Payer: 59 | Admitting: Physical Therapy

## 2020-07-10 ENCOUNTER — Encounter: Payer: Self-pay | Admitting: Physical Therapy

## 2020-07-10 DIAGNOSIS — M25512 Pain in left shoulder: Secondary | ICD-10-CM | POA: Diagnosis not present

## 2020-07-10 DIAGNOSIS — R293 Abnormal posture: Secondary | ICD-10-CM

## 2020-07-10 DIAGNOSIS — G8929 Other chronic pain: Secondary | ICD-10-CM

## 2020-07-10 DIAGNOSIS — R29898 Other symptoms and signs involving the musculoskeletal system: Secondary | ICD-10-CM

## 2020-07-10 DIAGNOSIS — M25612 Stiffness of left shoulder, not elsewhere classified: Secondary | ICD-10-CM

## 2020-07-10 NOTE — Therapy (Signed)
[facility] Outpatient Rehabilitation [facility] [address]  [address] [place], [place], [address] Phone: [phone]   Fax:  [phone]  Physical Therapy Treatment  Patient Details  Name: Matthew Gaines MRN: [id number] Date of Birth: 06/09/1961 Referring Provider (PT): Lavada Mesi, MD   Encounter Date: 07/10/2020   PT End of Session - 07/10/20 1122    Visit Number 6    Number of Visits 13    Date for PT Re-Evaluation 07/24/20    Authorization Type [employer]    Authorization - Visit Number 11    Authorization - Number of Visits 30    PT Start Time 1100    PT Stop Time 1117    PT Time Calculation (min) 17 min    Activity Tolerance Patient tolerated treatment well;Patient limited by pain    Behavior During Therapy Flat affect   somnolent          Past Medical History:  Diagnosis Date  . Allergy    Q 2-3 yrs has issues   . Arthritis   . Chest pain 04/25/2020  . Chest pain at rest 04/25/2020  . GERD (gastroesophageal reflux disease)    past hx   . Hyperlipidemia   . Overactive bladder   . Poor circulation     Past Surgical History:  Procedure Laterality Date  . CARDIAC CATHETERIZATION  1999  . CIRCUMCISION    . CORONARY ANGIOPLASTY WITH STENT PLACEMENT  1999  . LASER ABLATION OF VASCULAR LESION    . VASCULAR SURGERY      There were no vitals filed for this visit.   Subjective Assessment - 07/10/20 1103    Subjective Patient requesting that after warmup to get hooked up on TENS and apply tape to the shoulder- notes that he has a lot going on but is feeling fine physically. Does report that his shoulder is starting to feel a little better, but fluctuates quite a bit.    Pertinent History vascular surgery, cardiac event 04/25/20    Diagnostic tests 04/25/20 chest xray: No active cardiopulmonary disease.    Patient Stated Goals play tennis and basketball    Currently in Pain? No/denies                             [facility]  Adult PT Treatment/Exercise - 07/10/20 0001      Shoulder Exercises: Seated   Flexion Strengthening;Left;5 reps;Weights    Flexion Weight (lbs) 1    Flexion Limitations 2x5   c/o "resistance"     Shoulder Exercises: ROM/Strengthening   UBE (Upper Arm Bike) L2 x 3 min forward, 2 min back   discontinued d/t pain                   PT Short Term Goals - 06/19/20 1153      PT SHORT TERM GOAL #1   Title Patient to be independent with initial HEP.    Time 3    Period Weeks    Status On-going    Target Date 07/03/20             PT Long Term Goals - 06/19/20 1153      PT LONG TERM GOAL #1   Title Patient to be independent with advanced HEP.    Time 6    Period Weeks    Status On-going      PT LONG TERM GOAL #2   Title  Patient to demonstrate L shoulder strength >/=4+/5.    Time 6    Period Weeks    Status On-going      PT LONG TERM GOAL #3   Title Patient to demonstrate L shoulder AROM WFL and painless.    Time 6    Period Weeks    Status On-going      PT LONG TERM GOAL #4   Title Patient to report 80% improvement in pain with lifting household objects overhead.    Time 6    Period Weeks    Status On-going      PT LONG TERM GOAL #5   Title Patient to return to tennis and basketball with modifications as needed.    Time 6    Period Weeks    Status On-going                 Plan - 07/10/20 1123    Clinical Impression Statement Patient arrived to session frustrated about appointment scheduling and initially requesting to have TENS and KT tape application d/t "I have a lot going on." Upon discussion with patient, patient did agree to participate in a more active session. After attempted 1 strengthening exercise, patient denied shoulder pain but noted "this doesn't feel normal" and noted feeling of "resistance" in the shoulder and expressed fear of exercises hurting his shoulder. Explained the benefits of ther-ex to patient and requested patient's  participation. Patient requested to leave session as he did not want to participate today.    Comorbidities vascular surgery, chest pain    PT Treatment/Interventions ADLs/Self Care Home Management;Cryotherapy;Electrical Stimulation;Iontophoresis 4mg /ml Dexamethasone;Moist Heat;Therapeutic exercise;Therapeutic activities;Functional mobility training;Ultrasound;Neuromuscular re-education;Patient/family education;Manual techniques;Vasopneumatic Device;Taping;Energy conservation;Dry needling;Passive range of motion    PT Next Visit Plan periscapular strengthening and postural correction exercises    Consulted and Agree with Plan of Care Patient           Patient will benefit from skilled therapeutic intervention in order to improve the following deficits and impairments:  Increased edema, Decreased activity tolerance, Decreased strength, Increased fascial restricitons, Impaired UE functional use, Pain, Increased muscle spasms, Improper body mechanics, Decreased range of motion, Impaired flexibility, Postural dysfunction  Visit Diagnosis: Chronic left shoulder pain  Stiffness of left shoulder, not elsewhere classified  Abnormal posture  Other symptoms and signs involving the musculoskeletal system     Problem List Patient Active Problem List   Diagnosis Date Noted  . Angina pectoris (HCC) 06/15/2020  . Coronary artery disease involving native coronary artery of native heart with unstable angina pectoris (HCC) 05/16/2020  . Mixed hyperlipidemia 05/16/2020  . Chest pain at rest 04/25/2020  . Chest pain 04/25/2020     06/25/2020, PT, DPT 07/10/20 11:27 AM   [facility] [address]  [address] [place], [place], [place] Phone: [phone]   Fax:  [phone]  Name: Matthew Gaines MRN: [name] Date of Birth: 15-Jan-1961

## 2020-07-12 ENCOUNTER — Inpatient Hospital Stay (HOSPITAL_COMMUNITY): Admission: RE | Admit: 2020-07-12 | Payer: 59 | Source: Ambulatory Visit

## 2020-07-12 ENCOUNTER — Other Ambulatory Visit: Payer: Self-pay | Admitting: Medical

## 2020-07-13 ENCOUNTER — Telehealth (HOSPITAL_COMMUNITY): Payer: Self-pay | Admitting: *Deleted

## 2020-07-13 ENCOUNTER — Ambulatory Visit: Payer: 59

## 2020-07-13 NOTE — Telephone Encounter (Signed)
Attempted to call patient regarding upcoming cardiac CT appointment. Left message on wife's voicemail with name and callback number as pt's voicemail is full and unable to leave a message.  Burley Saver RN Navigator Cardiac Imaging [facility] [phone] Office [phone] Cell

## 2020-07-13 NOTE — Telephone Encounter (Signed)
Patient returning call regarding upcoming cardiac imaging study; pt verbalizes understanding of appt date/time, parking situation and where to check in, pre-test NPO status and medications ordered, and verified current allergies; name and call back number provided for further questions should they arise  Rheanna Sergent Tai RN Navigator Cardiac Imaging [facility] [phone] office [phone] cell  

## 2020-07-16 ENCOUNTER — Other Ambulatory Visit (HOSPITAL_COMMUNITY): Payer: Self-pay | Admitting: *Deleted

## 2020-07-16 ENCOUNTER — Ambulatory Visit (HOSPITAL_COMMUNITY): Admission: RE | Admit: 2020-07-16 | Payer: 59 | Source: Ambulatory Visit

## 2020-07-16 MED ORDER — METOPROLOL TARTRATE 100 MG PO TABS: 100.0000 mg | ORAL_TABLET | Freq: Once | ORAL | 0 refills | Status: DC

## 2020-07-17 ENCOUNTER — Ambulatory Visit: Payer: 59 | Admitting: Physical Therapy

## 2020-07-18 ENCOUNTER — Ambulatory Visit: Payer: 59

## 2020-07-18 DIAGNOSIS — G8929 Other chronic pain: Secondary | ICD-10-CM

## 2020-07-18 DIAGNOSIS — M25612 Stiffness of left shoulder, not elsewhere classified: Secondary | ICD-10-CM

## 2020-07-18 DIAGNOSIS — M25512 Pain in left shoulder: Secondary | ICD-10-CM | POA: Diagnosis not present

## 2020-07-18 DIAGNOSIS — R29898 Other symptoms and signs involving the musculoskeletal system: Secondary | ICD-10-CM

## 2020-07-18 DIAGNOSIS — R293 Abnormal posture: Secondary | ICD-10-CM

## 2020-07-18 NOTE — Therapy (Addendum)
[facility] [address]  [place], [place], [address] Phone: [phone]   Fax:  [phone]  Physical Therapy Treatment  Patient Details  Name: Matthew Gaines MRN: [id number] Date of Birth: Apr 07, 1961 Referring Provider (PT): Eunice Blase, MD   Encounter Date: 07/18/2020   PT End of Session - 07/18/20 1452    Visit Number 7    Number of Visits 13    Date for PT Re-Evaluation 07/24/20    Authorization Type [employer]    Authorization - Visit Number 12    Authorization - Number of Visits 30    PT Start Time 7939    PT Stop Time 0300    PT Time Calculation (min) 44 min    Activity Tolerance Patient tolerated treatment well;Patient limited by pain    Behavior During Therapy Flat affect   somnolent          Past Medical History:  Diagnosis Date  . Allergy    Q 2-3 yrs has issues   . Arthritis   . Chest pain 04/25/2020  . Chest pain at rest 04/25/2020  . GERD (gastroesophageal reflux disease)    past hx   . Hyperlipidemia   . Overactive bladder   . Poor circulation     Past Surgical History:  Procedure Laterality Date  . CARDIAC CATHETERIZATION  1999  . CIRCUMCISION    . CORONARY ANGIOPLASTY WITH STENT PLACEMENT  1999  . LASER ABLATION OF VASCULAR LESION    . VASCULAR SURGERY      There were no vitals filed for this visit.   Subjective Assessment - 07/18/20 1451    Subjective Pt. requesting not to warmup.  notes pain in L upper shoulder toda.  Reports, "my pain is not getting any better, I just want the TENS".    Pertinent History vascular surgery, cardiac event 04/25/20    Diagnostic tests 04/25/20 chest xray: No active cardiopulmonary disease.    Patient Stated Goals play tennis and basketball    Currently in Pain? Yes    Pain Score 6     Pain Location Shoulder    Pain Orientation Left    Pain Descriptors / Indicators Dull    Pain Type Acute pain              [facility] PT Assessment -  07/18/20 0001      AROM   Right/Left Shoulder Right;Left    Right Shoulder Flexion 130 Degrees    Left Shoulder Flexion 128 Degrees      Strength   Right/Left Shoulder Right;Left    Right Shoulder Flexion 4+/5    Right Shoulder ABduction 4+/5    Right Shoulder Internal Rotation 4+/5    Right Shoulder External Rotation 4/5    Left Shoulder Flexion 4/5    Left Shoulder ABduction 4/5    Left Shoulder Internal Rotation 4/5    Left Shoulder External Rotation 4-/5                         [facility] Adult PT Treatment/Exercise - 07/18/20 0001      Self-Care   Self-Care Other Self-Care Comments    Other Self-Care Comments  Discussion of patient odd jobs, yardwork, and moving furniture to check for possible trigger for current complaint of L shoulder pain                    PT Short Term  Goals - 06/19/20 1153      PT SHORT TERM GOAL #1   Title Patient to be independent with initial HEP.    Time 3    Period Weeks    Status On-going    Target Date 07/03/20             PT Long Term Goals - 07/18/20 1825      PT LONG TERM GOAL #1   Title Patient to be independent with advanced HEP.    Time 6    Period Weeks    Status On-going      PT LONG TERM GOAL #2   Title Patient to demonstrate L shoulder strength >/=4+/5.    Time 6    Period Weeks    Status Partially Met      PT LONG TERM GOAL #3   Title Patient to demonstrate L shoulder AROM WFL and painless.    Time 6    Period Weeks    Status On-going      PT LONG TERM GOAL #4   Title Patient to report 80% improvement in pain with lifting household objects overhead.    Time 6    Period Weeks    Status On-going   noted no improvement in L shoulder     PT LONG TERM GOAL #5   Title Patient to return to tennis and basketball with modifications as needed.    Time 6    Period Weeks    Status On-going   unable per report                Plan - 07/18/20 1456    Clinical Impression Statement  Lum seen to start session expressing that he feels he is no longer making progress with physical therapy.  Patient has been largely unwilling to participate in therapeutic exercises withing his visits with PT often requesting to "only do the electrical stimulation" during sessions.  Patient expressing that he feels there is a "boney problem" in his L shoulder and wishes to see referring MD soon for f/u.  Strength testing with MMT and L shoulder AROM measurements revealing largely unchanged however frequent indications that patient avoids performing HEP verbalizing poor understanding.  Patient does report "moving furniture" and trimming the bushes yesterday.  With some complaint of 7/10 pain in session today at rest at L superior shoulder.  After conversation with supervising PT decided to go on 30-day hold from physical therapy pending f/u with MD.  Most LTGs largely unmet.  Pt. now on 30-day hold.    Comorbidities vascular surgery, chest pain    Rehab Potential Good    PT Frequency 2x / week    PT Treatment/Interventions ADLs/Self Care Home Management;Cryotherapy;Electrical Stimulation;Iontophoresis 37m/ml Dexamethasone;Moist Heat;Therapeutic exercise;Therapeutic activities;Functional mobility training;Ultrasound;Neuromuscular re-education;Patient/family education;Manual techniques;Vasopneumatic Device;Taping;Energy conservation;Dry needling;Passive range of motion    PT Next Visit Plan periscapular strengthening and postural correction exercises    Consulted and Agree with Plan of Care Patient           Patient will benefit from skilled therapeutic intervention in order to improve the following deficits and impairments:  Increased edema, Decreased activity tolerance, Decreased strength, Increased fascial restricitons, Impaired UE functional use, Pain, Increased muscle spasms, Improper body mechanics, Decreased range of motion, Impaired flexibility, Postural dysfunction  Visit Diagnosis: Chronic  left shoulder pain  Stiffness of left shoulder, not elsewhere classified  Abnormal posture  Other symptoms and signs involving the musculoskeletal system     Problem List  Patient Active Problem List   Diagnosis Date Noted  . Angina pectoris ([place]) 06/15/2020  . Coronary artery disease involving native coronary artery of native heart with unstable angina pectoris ([place]) 05/16/2020  . Mixed hyperlipidemia 05/16/2020  . Chest pain at rest 04/25/2020  . Chest pain 04/25/2020    Bess Harvest, PTA 07/18/20 6:27 PM   [facility] [address]  [place], [place], [address] Phone: [phone]   Fax:  [phone]  Name: Joal Eakle MRN: [id number] Date of Birth: Jun 10, 1961   PHYSICAL THERAPY DISCHARGE SUMMARY  Visits from Start of Care: 7  Current functional level related to goals / functional outcomes: See above clinical impression; patient did not return during 30 day hold   Remaining deficits: Decreased shoulder ROM and strength, pain   Education / Equipment: HEP  Plan: Patient agrees to discharge.  Patient goals were not met. Patient is being discharged due to lack of progress.  ?????     Janene Harvey, PT, DPT 08/28/20 11:50 AM

## 2020-07-20 ENCOUNTER — Ambulatory Visit: Payer: 59

## 2020-07-24 ENCOUNTER — Ambulatory Visit: Payer: 59 | Admitting: Physical Therapy

## 2020-07-25 ENCOUNTER — Telehealth: Payer: Self-pay | Admitting: Medical

## 2020-07-25 MED ORDER — AMOXICILLIN-POT CLAVULANATE 875-125 MG PO TABS: 1.0000 | ORAL_TABLET | Freq: Two times a day (BID) | ORAL | 0 refills | Status: DC

## 2020-07-25 NOTE — Telephone Encounter (Signed)
Pt.notified

## 2020-07-25 NOTE — Telephone Encounter (Signed)
I sent in pt prescription of augmentin to his pharmacy for tooth issues since apparently dentist appointment fell thru as he showed up last week randomly last week asking for this.  You sent me [employer] note. Send me a note in epic on things like this. That way will have reminder.  If you would call and advise sent in antibiotic. He also still needs to see dentist.

## 2020-07-25 NOTE — Telephone Encounter (Signed)
Called pt and lvm to return call.  

## 2020-07-30 ENCOUNTER — Ambulatory Visit: Payer: 59 | Admitting: Family Medicine

## 2020-07-31 ENCOUNTER — Other Ambulatory Visit: Payer: Self-pay

## 2020-07-31 ENCOUNTER — Telehealth: Payer: Self-pay

## 2020-07-31 DIAGNOSIS — I209 Angina pectoris, unspecified: Secondary | ICD-10-CM

## 2020-07-31 DIAGNOSIS — I8390 Asymptomatic varicose veins of unspecified lower extremity: Secondary | ICD-10-CM

## 2020-07-31 NOTE — Telephone Encounter (Signed)
-----   Message from Matthew Gaines sent at 07/31/2020 10:56 AM EDT ----- Regarding: Ct heart   Patient r/s to 8/18 @ 11:30am..  He needs labs  Thanks, Sheralyn Boatman

## 2020-07-31 NOTE — Telephone Encounter (Signed)
Pt is aware of repeat labs needed. Order placed.

## 2020-08-02 ENCOUNTER — Other Ambulatory Visit (HOSPITAL_COMMUNITY): Payer: Self-pay | Admitting: Cardiology

## 2020-08-06 ENCOUNTER — Telehealth (HOSPITAL_COMMUNITY): Payer: Self-pay | Admitting: Emergency Medicine

## 2020-08-06 MED ORDER — METOPROLOL TARTRATE 100 MG PO TABS: 100.0000 mg | ORAL_TABLET | Freq: Once | ORAL | 0 refills | Status: DC

## 2020-08-06 NOTE — Telephone Encounter (Signed)
Reaching out to patient to offer assistance regarding upcoming cardiac imaging study; pt verbalizes understanding of appt date/time, parking situation and where to check in, pre-test NPO status and medications ordered, and verified current allergies; name and call back number provided for further questions should they arise Rockwell Alexandria RN Navigator Cardiac Imaging [facility] [phone] office [phone] cell   Confirmed with patient that he does NOT have stents from prior cath.  Reminded patient to pick up metoprolol from pharmacy

## 2020-08-08 ENCOUNTER — Ambulatory Visit (HOSPITAL_COMMUNITY)
Admission: RE | Admit: 2020-08-08 | Discharge: 2020-08-08 | Disposition: A | Discharge status: Home / Self Care | Payer: 59 | Source: Ambulatory Visit | Attending: Cardiology | Admitting: Cardiology

## 2020-08-08 DIAGNOSIS — I209 Angina pectoris, unspecified: Secondary | ICD-10-CM | POA: Insufficient documentation

## 2020-08-08 DIAGNOSIS — I208 Other forms of angina pectoris: Secondary | ICD-10-CM

## 2020-08-08 MED ORDER — IOHEXOL 350 MG/ML SOLN
80.0000 mL | Freq: Once | INTRAVENOUS | Status: AC | PRN
  Administered 2020-08-08: 80 mL via INTRAVENOUS

## 2020-08-08 MED ORDER — NITROGLYCERIN 0.4 MG SL SUBL
SUBLINGUAL_TABLET | SUBLINGUAL | Status: AC
  Administered 2020-08-08: 0.8 mg via SUBLINGUAL
  Filled 2020-08-08: qty 2

## 2020-08-08 MED ORDER — NITROGLYCERIN 0.4 MG SL SUBL: 0.8000 mg | SUBLINGUAL_TABLET | Freq: Once | SUBLINGUAL | Status: AC

## 2020-08-09 ENCOUNTER — Other Ambulatory Visit: Payer: Self-pay

## 2020-08-09 ENCOUNTER — Ambulatory Visit (INDEPENDENT_AMBULATORY_CARE_PROVIDER_SITE_OTHER): Payer: 59 | Admitting: Medical

## 2020-08-09 VITALS — BP 112/80 | HR 59 | Resp 18 | Ht 73.0 in | Wt 148.8 lb

## 2020-08-09 DIAGNOSIS — N400 Enlarged prostate without lower urinary tract symptoms: Secondary | ICD-10-CM

## 2020-08-09 DIAGNOSIS — F419 Anxiety disorder, unspecified: Secondary | ICD-10-CM

## 2020-08-09 DIAGNOSIS — I8393 Asymptomatic varicose veins of bilateral lower extremities: Secondary | ICD-10-CM

## 2020-08-09 DIAGNOSIS — I10 Essential (primary) hypertension: Secondary | ICD-10-CM

## 2020-08-09 DIAGNOSIS — K0889 Other specified disorders of teeth and supporting structures: Secondary | ICD-10-CM

## 2020-08-09 NOTE — Patient Instructions (Addendum)
Bp is well controlled. Continue current b blocker.   For bph continue flomax. Will follow psa and repeat in 2 months. If increasing then refer to urologist.  For anxiety much improved continue buspar.  For varicose veins see vein specialist.  For tooth pain hx resolved presently but needing dentist evaluation I gave 4 number of clinic to call.  For reflux continue protonix and follow up with GI MD.  Follow up 2 months or as needed

## 2020-08-09 NOTE — Progress Notes (Signed)
Subjective:    Patient ID: Matthew Gaines, male    DOB: 11/09/1961, 59 y.o.   MRN: [id number]  HPI  Pt in for follow up.  Pt has varicose vein hx. Pt is seeing vascular MD next week.   Pt had Ct coronary studies done yesterday.   IMPRESSION: 1. No evidence of CAD, CADRADS = 0.  2. Coronary calcium score of 0. This was 0 percentile for age and sex matched control.  3. Normal coronary origin with right dominance.   Pt has had some tooth pain recently. I had given information on dentist to see. But he could not get treated. I had sent him in antibiotic. He did not proceed with extraction. Gave 4 number to various free/low cost/sliding scale clnics.  Pt is going to see GI MD on 24 th.   Pt has recently anxiety. I had written buspar. Med working real very well.   Pt states he is urinating a lot easier now. Less frequent and less dribbling with flomax. Pt psa was good recently. Prior urologist in [place] did work up and treated for prostatitis with cipro. Back then his psa was 4.0. they never did biopsy.     Review of Systems  Constitutional: Negative for chills, fatigue and fever.  HENT: Negative for congestion and ear pain.   Respiratory: Negative for cough, chest tightness, shortness of breath and wheezing.   Cardiovascular: Negative for chest pain and palpitations.  Gastrointestinal: Negative for abdominal pain, constipation, nausea and vomiting.  Musculoskeletal: Negative for back pain and myalgias.  Skin: Negative for rash.  Neurological: Negative for dizziness, weakness and headaches.  Hematological: Negative for adenopathy. Does not bruise/bleed easily.  Psychiatric/Behavioral: Negative for behavioral problems, confusion, sleep disturbance and suicidal ideas. The patient is nervous/anxious.        See hpi.    Past Medical History:  Diagnosis Date  . Allergy    Q 2-3 yrs has issues   . Arthritis   . Chest pain 04/25/2020  . Chest pain at rest 04/25/2020  . GERD  (gastroesophageal reflux disease)    past hx   . Hyperlipidemia   . Overactive bladder   . Poor circulation      Social History   Socioeconomic History  . Marital status: Married    Spouse name: Not on file  . Number of children: Not on file  . Years of education: Not on file  . Highest education level: Not on file  Occupational History  . Not on file  Tobacco Use  . Smoking status: Current Some Day Smoker    Types: Cigarettes  . Smokeless tobacco: Never Used  . Tobacco comment: 2-3 a day - some days none   Substance and Sexual Activity  . Alcohol use: Yes    Comment: occ   . Drug use: Never  . Sexual activity: Not on file  Other Topics Concern  . Not on file  Social History Narrative  . Not on file   Social Determinants of Health   Financial Resource Strain:   . Difficulty of Paying Living Expenses: Not on file  Food Insecurity:   . Worried About Charity fundraiser in the Last Year: Not on file  . Ran Out of Food in the Last Year: Not on file  Transportation Needs:   . Lack of Transportation (Medical): Not on file  . Lack of Transportation (Non-Medical): Not on file  Physical Activity:   . Days of Exercise per Week: Not  on file  . Minutes of Exercise per Session: Not on file  Stress:   . Feeling of Stress : Not on file  Social Connections:   . Frequency of Communication with Friends and Family: Not on file  . Frequency of Social Gatherings with Friends and Family: Not on file  . Attends Religious Services: Not on file  . Active Member of Clubs or Organizations: Not on file  . Attends Archivist Meetings: Not on file  . Marital Status: Not on file  Intimate Partner Violence:   . Fear of Current or Ex-Partner: Not on file  . Emotionally Abused: Not on file  . Physically Abused: Not on file  . Sexually Abused: Not on file    Past Surgical History:  Procedure Laterality Date  . CARDIAC CATHETERIZATION  1999  . CIRCUMCISION    . CORONARY  ANGIOPLASTY WITH STENT PLACEMENT  1999  . LASER ABLATION OF VASCULAR LESION    . VASCULAR SURGERY      Family History  Problem Relation Age of Onset  . Lung cancer Mother   . Colon polyps Brother   . Colon cancer Neg Hx   . Esophageal cancer Neg Hx   . Rectal cancer Neg Hx   . Stomach cancer Neg Hx     No Known Allergies  Current Outpatient Medications on File Prior to Visit  Medication Sig Dispense Refill  . amoxicillin-clavulanate (AUGMENTIN) 875-125 MG tablet Take 1 tablet by mouth 2 (two) times daily. (Patient not taking: Reported on 08/09/2020) 20 tablet 0  . aspirin 81 MG chewable tablet Chew 81 mg by mouth daily.     . Blood Pressure KIT 1 kit by Does not apply route in the morning and at bedtime. 1 kit 0  . busPIRone (BUSPAR) 7.5 MG tablet Take 1 tablet (7.5 mg total) by mouth 2 (two) times daily. 60 tablet 5  . Hyprom-Naphaz-Polysorb-Zn Sulf (CLEAR EYES COMPLETE OP) Place 1 drop into both eyes daily as needed (for dry eyes).     Marland Kitchen lidocaine (LIDODERM) 5 % Place 1 patch onto the skin daily. Remove & Discard patch within 12 hours or as directed by MD 30 patch 0  . Menthol-Methyl Salicylate (MUSCLE RUB) 10-15 % CREA Apply 1 application topically as needed for muscle pain.    . metoprolol tartrate (LOPRESSOR) 100 MG tablet Take 1 tablet (100 mg total) by mouth once for 1 dose. Take 2 hours prior to your CT if your heart rate is greater than 55 1 tablet 0  . Multiple Vitamin (MULTIVITAMIN WITH MINERALS) TABS tablet Take 1 tablet by mouth daily.    . nitroGLYCERIN (NITROSTAT) 0.4 MG SL tablet Place 1 tablet (0.4 mg total) under the tongue every 5 (five) minutes as needed. 25 tablet 3  . Omega-3 Fatty Acids (FISH OIL) 1000 MG CAPS Take 1,000 mg by mouth daily.    . pantoprazole (PROTONIX) 40 MG tablet Take 1 tablet (40 mg total) by mouth daily. 30 tablet 0  . tamsulosin (FLOMAX) 0.4 MG CAPS capsule Take 1 capsule (0.4 mg total) by mouth daily. 30 capsule 3   No current  facility-administered medications on file prior to visit.    BP 112/80 (BP Location: Left Arm, Patient Position: Sitting, Cuff Size: Large)   Pulse (!) 59   Resp 18   Ht _0  (1.854 m)   Wt 148 lb 12.8 oz (67.5 kg)   SpO2 100%   BMI 19.63 kg/m  Objective:   Physical Exam  General Mental Status- Alert. General Appearance- Not in acute distress.   Skin General: Color- Normal Color. Moisture- Normal Moisture.  Neck Carotid Arteries- Normal color. Moisture- Normal Moisture. No carotid bruits. No JVD.  Chest and Lung Exam Auscultation: Breath Sounds:-Normal.  Cardiovascular Auscultation:Rythm- Regular. Murmurs & Other Heart Sounds:Auscultation of the heart reveals- No Murmurs.  Abdomen Inspection:-Inspeection Normal. Palpation/Percussion:Note:No mass. Palpation and Percussion of the abdomen reveal- Non Tender, Non Distended + BS, no rebound or guarding.    Neurologic Cranial Nerve exam:- CN III-XII intact(No nystagmus), symmetric smile. Strength:- 5/5 equal and symmetric strength both upper and lower extremities.      Assessment & Plan:  Bp is well controlled. Continue current b blocker.   For bph continue flomax. Will follow psa and repeat in 2 months. If increasing then refer to urologist.  For anxiety much improved continue buspar.  For varicose veins see vein specialist.  For tooth pain hx  resolved presently but needing dentist evaluation I gave 4 number of clinic to call.  For reflux continue protonix and follow up with GI MD.  Follow up 2 months or as needed  Mackie Pai, PA-C   Time spent with patient today was 30  minutes which consisted of chart review, discussing diagnoses, work up, treatment, answering questions  and documentation.

## 2020-08-10 ENCOUNTER — Encounter: Payer: Self-pay | Admitting: Cardiology

## 2020-08-10 ENCOUNTER — Ambulatory Visit (INDEPENDENT_AMBULATORY_CARE_PROVIDER_SITE_OTHER): Payer: 59 | Admitting: Cardiology

## 2020-08-10 VITALS — BP 104/70 | HR 64 | Ht 73.0 in | Wt 147.0 lb

## 2020-08-10 DIAGNOSIS — R0789 Other chest pain: Secondary | ICD-10-CM | POA: Diagnosis not present

## 2020-08-10 DIAGNOSIS — E782 Mixed hyperlipidemia: Secondary | ICD-10-CM

## 2020-08-10 NOTE — Patient Instructions (Signed)

## 2020-08-10 NOTE — Progress Notes (Signed)
Cardiology Office Note:    Date:  08/10/2020   ID:  Matthew Gaines, DOB [birth year], MRN [id number]  PCP:  Mackie Pai, PA-C  Cardiologist:  Berniece Salines, DO  Electrophysiologist:  None   Referring MD: Elise Benne   Chief Complaint  Patient presents with  . Follow-up    History of Present Illness:    Matthew Gaines is a 59 y.o. male with a hx of reported that he had a cardiac catheterization in the 90s and he was told that he had mild nonobstructive coronary artery disease, did see the patient in May 2021 at that time he was experiencing chest pain and had just recently been admitted at our hospital for chest pain during that admission the patient was found to be positive for cocaine.  During his visit with me  I recommended patient undergo a nuclear stress test.  He had a nuclear stress test on June 06, 2020 this was normal.  He did see my partner on June 15, 2020 and reported that he had also been having multiple episodes of chest pain.  Given the fact that there was repeated continuing pain a coronary CTA was ordered.  The patient had a coronary CTA which was normal with 0 calcium-with no evidence of coronary artery disease.  The patient is here today for follow-up visit.  He has had an episode of chest pain.  He tells me that he is undergoing a great deal of stress.  He is having multiple family issue which is causing him problems.  Past Medical History:  Diagnosis Date  . Allergy    Q 2-3 yrs has issues   . Arthritis   . Chest pain 04/25/2020  . Chest pain at rest 04/25/2020  . GERD (gastroesophageal reflux disease)    past hx   . Hyperlipidemia   . Overactive bladder   . Poor circulation     Past Surgical History:  Procedure Laterality Date  . CARDIAC CATHETERIZATION  1999  . CIRCUMCISION    . CORONARY ANGIOPLASTY WITH STENT PLACEMENT  1999  . LASER ABLATION OF VASCULAR LESION    . VASCULAR SURGERY      Current Medications: Current Meds  Medication Sig  .  amoxicillin-clavulanate (AUGMENTIN) 875-125 MG tablet Take 1 tablet by mouth 2 (two) times daily.  Marland Kitchen aspirin 81 MG chewable tablet Chew 81 mg by mouth daily.   . Blood Pressure KIT 1 kit by Does not apply route in the morning and at bedtime.  . busPIRone (BUSPAR) 7.5 MG tablet Take 1 tablet (7.5 mg total) by mouth 2 (two) times daily.  . Hyprom-Naphaz-Polysorb-Zn Sulf (CLEAR EYES COMPLETE OP) Place 1 drop into both eyes daily as needed (for dry eyes).   Marland Kitchen lidocaine (LIDODERM) 5 % Place 1 patch onto the skin daily. Remove & Discard patch within 12 hours or as directed by MD  . Menthol-Methyl Salicylate (MUSCLE RUB) 10-15 % CREA Apply 1 application topically as needed for muscle pain.  . Multiple Vitamin (MULTIVITAMIN WITH MINERALS) TABS tablet Take 1 tablet by mouth daily.  . nitroGLYCERIN (NITROSTAT) 0.4 MG SL tablet Place 1 tablet (0.4 mg total) under the tongue every 5 (five) minutes as needed.  . Omega-3 Fatty Acids (FISH OIL) 1000 MG CAPS Take 1,000 mg by mouth daily.  . pantoprazole (PROTONIX) 40 MG tablet Take 1 tablet (40 mg total) by mouth daily.  . tamsulosin (FLOMAX) 0.4 MG CAPS capsule Take 1 capsule (0.4 mg total) by mouth daily.  Allergies:   Patient has no known allergies.   Social History   Socioeconomic History  . Marital status: Married    Spouse name: Not on file  . Number of children: Not on file  . Years of education: Not on file  . Highest education level: Not on file  Occupational History  . Not on file  Tobacco Use  . Smoking status: Current Some Day Smoker    Types: Cigarettes  . Smokeless tobacco: Never Used  . Tobacco comment: 2-3 a day - some days none   Substance and Sexual Activity  . Alcohol use: Yes    Comment: occ   . Drug use: Never  . Sexual activity: Not on file  Other Topics Concern  . Not on file  Social History Narrative  . Not on file   Social Determinants of Health   Financial Resource Strain:   . Difficulty of Paying Living  Expenses: Not on file  Food Insecurity:   . Worried About Charity fundraiser in the Last Year: Not on file  . Ran Out of Food in the Last Year: Not on file  Transportation Needs:   . Lack of Transportation (Medical): Not on file  . Lack of Transportation (Non-Medical): Not on file  Physical Activity:   . Days of Exercise per Week: Not on file  . Minutes of Exercise per Session: Not on file  Stress:   . Feeling of Stress : Not on file  Social Connections:   . Frequency of Communication with Friends and Family: Not on file  . Frequency of Social Gatherings with Friends and Family: Not on file  . Attends Religious Services: Not on file  . Active Member of Clubs or Organizations: Not on file  . Attends Archivist Meetings: Not on file  . Marital Status: Not on file     Family History: The patient's family history includes Colon polyps in his brother; Lung cancer in his mother. There is no history of Colon cancer, Esophageal cancer, Rectal cancer, or Stomach cancer.  ROS:   Review of Systems  Constitution: Negative for decreased appetite, fever and weight gain.  HENT: Negative for congestion, ear discharge, hoarse voice and sore throat.   Eyes: Negative for discharge, redness, vision loss in right eye and visual halos.  Cardiovascular: Negative for chest pain, dyspnea on exertion, leg swelling, orthopnea and palpitations.  Respiratory: Negative for cough, hemoptysis, shortness of breath and snoring.   Endocrine: Negative for heat intolerance and polyphagia.  Hematologic/Lymphatic: Negative for bleeding problem. Does not bruise/bleed easily.  Skin: Negative for flushing, nail changes, rash and suspicious lesions.  Musculoskeletal: Negative for arthritis, joint pain, muscle cramps, myalgias, neck pain and stiffness.  Gastrointestinal: Negative for abdominal pain, bowel incontinence, diarrhea and excessive appetite.  Genitourinary: Negative for decreased libido, genital sores  and incomplete emptying.  Neurological: Negative for brief paralysis, focal weakness, headaches and loss of balance.  Psychiatric/Behavioral: Negative for altered mental status, depression and suicidal ideas.  Allergic/Immunologic: Negative for HIV exposure and persistent infections.    EKGs/Labs/Other Studies Reviewed:    The following studies were reviewed today:   EKG: None today  CCTA IMPRESSION: 1. No evidence of CAD, CADRADS = 0.  2. Coronary calcium score of 0. This was 0 percentile for age and sex matched control.  3. Normal coronary origin with right dominance.    Pharmacologic   Nuclear stress EF: 53%.  The left ventricular ejection fraction is normal (55-65%).  Blood pressure demonstrated a normal response to exercise.  There was no ST segment deviation noted during stress.  The study is normal.  This is a low risk study.   Normal exercisenuclear study with no evidence for prior infarct or ischemia. Normal LVEF. Excellent exercise capacity.   Recent Labs: 04/02/2020: ALT 14 04/25/2020: Hemoglobin 14.7; Platelets 231 06/20/2020: BUN 18; Creatinine, Ser 0.98; Potassium 4.8; Sodium 141  Recent Lipid Panel    Component Value Date/Time   CHOL 184 04/25/2020 1824   TRIG 40 04/25/2020 1824   HDL 63 04/25/2020 1824   CHOLHDL 2.9 04/25/2020 1824   VLDL 8 04/25/2020 1824   LDLCALC 113 (H) 04/25/2020 1824    Physical Exam:    VS:  BP 104/70 (BP Location: Left Arm, Patient Position: Sitting, Cuff Size: Normal)   Pulse 64   Ht '6\' 1"'  (1.854 m)   Wt 147 lb (66.7 kg)   SpO2 97%   BMI 19.39 kg/m     Wt Readings from Last 3 Encounters:  08/10/20 147 lb (66.7 kg)  08/09/20 148 lb 12.8 oz (67.5 kg)  07/09/20 154 lb (69.9 kg)     GEN: Well nourished, well developed in no acute distress HEENT: Normal NECK: No JVD; No carotid bruits LYMPHATICS: No lymphadenopathy CARDIAC: S1S2 noted,RRR, no murmurs, rubs, gallops RESPIRATORY:  Clear to auscultation without  rales, wheezing or rhonchi  ABDOMEN: Soft, non-tender, non-distended, +bowel sounds, no guarding. EXTREMITIES: No edema, No cyanosis, no clubbing MUSCULOSKELETAL:  No deformity  SKIN: Warm and dry NEUROLOGIC:  Alert and oriented x 3, non-focal PSYCHIATRIC:  Normal affect, good insight  ASSESSMENT:    1. Mixed hyperlipidemia   2. Atypical chest pain    PLAN:      His chest pain is atypical not atherosclerotic.  We will have to investigate other causes of chest pain which could be in the setting of anxiety.  Unfortunately if the patient uses cocaine this could potentially cause chest pain.  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  Hyperlipidemia-we will continue with diet modification at this time.  He also notes that he is planning surgery for his leg-varicose veins.  From a cardiovascular standpoint the patient can proceed with his surgery and does not need any further testing prior to this.  The patient is in agreement with the above plan. The patient left the office in stable condition.  The patient will follow up in 6 months or sooner if needed.   Medication Adjustments/Labs and Tests Ordered: Current medicines are reviewed at length with the patient today.  Concerns regarding medicines are outlined above.  No orders of the defined types were placed in this encounter.  No orders of the defined types were placed in this encounter.   Patient Instructions  Medication Instructions:  No medication changes. *If you need a refill on your cardiac medications before your next appointment, please call your pharmacy*   Lab Work: None ordered If you have labs (blood work) drawn today and your tests are completely normal, you will receive your results only by: Marland Kitchen MyChart Message (if you have MyChart) OR . A paper copy in the mail If you have any lab test that is abnormal or we need to change your treatment, we will call you to review the  results.   Testing/Procedures: None ordered   Follow-Up: At [facility], you and your health needs are our priority.  As part of  our continuing mission to provide you with exceptional heart care, we have created designated Provider Care Teams.  These Care Teams include your primary Cardiologist (physician) and Advanced Practice Providers (APPs -  Physician Assistants and Nurse Practitioners) who all work together to provide you with the care you need, when you need it.  We recommend signing up for the patient portal called "MyChart".  Sign up information is provided on this After Visit Summary.  MyChart is used to connect with patients for Virtual Visits (Telemedicine).  Patients are able to view lab/test results, encounter notes, upcoming appointments, etc.  Non-urgent messages can be sent to your provider as well.   To learn more about what you can do with MyChart, go to [email or web address].    Your next appointment:   6 month(s)  The format for your next appointment:   In Person  Provider:   Berniece Salines, DO   Other Instructions NA     Adopting a Healthy Lifestyle.  Know what a healthy weight is for you (roughly BMI <25) and aim to maintain this   Aim for 7+ servings of fruits and vegetables daily   65-80+ fluid ounces of water or unsweet tea for healthy kidneys   Limit to max 1 drink of alcohol per day; avoid smoking/tobacco   Limit animal fats in diet for cholesterol and heart health - choose grass fed whenever available   Avoid highly processed foods, and foods high in saturated/trans fats   Aim for low stress - take time to unwind and care for your mental health   Aim for 150 min of moderate intensity exercise weekly for heart health, and weights twice weekly for bone health   Aim for 7-9 hours of sleep daily   When it comes to diets, agreement about the perfect plan isnt easy to find, even among the experts. Experts at the [employer] developed an idea known as the Healthy Eating Plate. Just imagine a plate divided into logical, healthy portions.   The emphasis is on diet quality:   Load up on vegetables and fruits - one-half of your plate: Aim for color and variety, and remember that potatoes dont count.   Go for whole grains - one-quarter of your plate: Whole wheat, barley, wheat berries, quinoa, oats, brown rice, and foods made with them. If you want pasta, go with whole wheat pasta.   Protein power - one-quarter of your plate: Fish, chicken, beans, and nuts are all healthy, versatile protein sources. Limit red meat.   The diet, however, does go beyond the plate, offering a few other suggestions.   Use healthy plant oils, such as olive, canola, soy, corn, sunflower and peanut. Check the labels, and avoid partially hydrogenated oil, which have unhealthy trans fats.   If youre thirsty, drink water. Coffee and tea are good in moderation, but skip sugary drinks and limit milk and dairy products to one or two daily servings.   The type of carbohydrate in the diet is more important than the amount. Some sources of carbohydrates, such as vegetables, fruits, whole grains, and beans-are healthier than others.   Finally, stay active  Signed, Berniece Salines, DO  08/10/2020 9:04 AM    [place]

## 2020-08-14 ENCOUNTER — Other Ambulatory Visit: Payer: Self-pay

## 2020-08-14 ENCOUNTER — Encounter: Payer: Self-pay | Admitting: Gastroenterology

## 2020-08-14 ENCOUNTER — Ambulatory Visit (INDEPENDENT_AMBULATORY_CARE_PROVIDER_SITE_OTHER): Payer: 59 | Admitting: Gastroenterology

## 2020-08-14 VITALS — BP 106/64 | HR 73 | Ht 73.0 in | Wt 148.5 lb

## 2020-08-14 DIAGNOSIS — R0789 Other chest pain: Secondary | ICD-10-CM | POA: Diagnosis not present

## 2020-08-14 DIAGNOSIS — Z1212 Encounter for screening for malignant neoplasm of rectum: Secondary | ICD-10-CM | POA: Diagnosis not present

## 2020-08-14 DIAGNOSIS — Z1211 Encounter for screening for malignant neoplasm of colon: Secondary | ICD-10-CM | POA: Diagnosis not present

## 2020-08-14 DIAGNOSIS — K219 Gastro-esophageal reflux disease without esophagitis: Secondary | ICD-10-CM | POA: Diagnosis not present

## 2020-08-14 NOTE — Patient Instructions (Signed)
If you are age 61 or older, your body mass index should be between 23-30. Your Body mass index is 19.59 kg/m. If this is out of the aforementioned range listed, please consider follow up with your Primary Care Provider.  If you are age 71 or younger, your body mass index should be between 19-25. Your Body mass index is 19.59 kg/m. If this is out of the aformentioned range listed, please consider follow up with your Primary Care Provider.   You have been scheduled for an endoscopy and colonoscopy. Please follow the written instructions given to you at your visit today. Please pick up your prep supplies at the pharmacy within the next 1-3 days. If you use inhalers (even only as needed), please bring them with you on the day of your procedure.  It was a pleasure to see you today!  Vito Cirigliano, D.O.
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
## 2020-08-15 ENCOUNTER — Ambulatory Visit (INDEPENDENT_AMBULATORY_CARE_PROVIDER_SITE_OTHER): Payer: 59 | Admitting: Physician Assistant

## 2020-08-15 ENCOUNTER — Ambulatory Visit (HOSPITAL_COMMUNITY)
Admission: RE | Admit: 2020-08-15 | Discharge: 2020-08-15 | Disposition: A | Discharge status: Home / Self Care | Payer: 59 | Source: Ambulatory Visit | Attending: Vascular Surgery | Admitting: Vascular Surgery

## 2020-08-15 VITALS — BP 120/85 | HR 73 | Temp 98.7°F | Resp 20 | Ht 73.0 in | Wt 147.5 lb

## 2020-08-15 DIAGNOSIS — M25561 Pain in right knee: Secondary | ICD-10-CM

## 2020-08-15 DIAGNOSIS — M25562 Pain in left knee: Secondary | ICD-10-CM | POA: Diagnosis not present

## 2020-08-15 DIAGNOSIS — I8390 Asymptomatic varicose veins of unspecified lower extremity: Secondary | ICD-10-CM | POA: Insufficient documentation

## 2020-08-15 DIAGNOSIS — I25110 Atherosclerotic heart disease of native coronary artery with unstable angina pectoris: Secondary | ICD-10-CM | POA: Diagnosis not present

## 2020-08-15 NOTE — Progress Notes (Signed)
[facility] [place]   Reason for referral: S/P venous reflux treatments and wanted a follow up since he moved to [place] from [place].  History of Present Illness  Matthew Gaines is a 59 y.o. male who presents with chief complaint: he is worried about possibly having a DVT.  He has no symptoms or past medical history of DVT.  He has had several vein procedures starting in 2015 combined laser ablation with stab phlebectomy.  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    He periodically wears compression and after 2 hours of standing he has to stop and elevate his legs for at least 15 min.  He tries to exercise daily to maintain his strength.  He denise symptoms of cellulitis, claudication, non healing wounds or rest pain in B LE.    He takes ASA and Crestor daily.  He denise DM and CAD.  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    Past Medical History:  Diagnosis Date  . Allergy    Q 2-3 yrs has issues   . Arthritis   . Chest pain 04/25/2020  . Chest pain at rest 04/25/2020  . GERD (gastroesophageal reflux disease)    past hx   . Hyperlipidemia   . Overactive bladder   . Poor circulation     Past Surgical History:  Procedure Laterality Date  . CARDIAC CATHETERIZATION  1999  . CARDIAC CATHETERIZATION  1999  . CIRCUMCISION    . CORONARY ANGIOPLASTY WITH STENT PLACEMENT  1999  . LASER ABLATION OF VASCULAR LESION    . VASCULAR SURGERY      Social History   Socioeconomic History  . Marital status: Married    Spouse name: Not on file  . Number of children: Not on file  . Years of education: Not on file  . Highest education level: Not on file  Occupational History  . Not on file  Tobacco Use  . Smoking status: Current Some Day Smoker    Types: Cigarettes  . Smokeless tobacco: Never Used  . Tobacco comment: 2-3 a day - some days none   Substance and Sexual Activity  . Alcohol use: Yes    Comment: occ   . Drug use: Never  . Sexual activity: Not on  file  Other Topics Concern  . Not on file  Social History Narrative  . Not on file   Social Determinants of Health   Financial Resource Strain:   . Difficulty of Paying Living Expenses: Not on file  Food Insecurity:   . Worried About Charity fundraiser in the Last Year: Not on file  . Ran Out of Food in the Last Year: Not on file  Transportation Needs:   . Lack of Transportation (Medical): Not on file  . Lack of Transportation (Non-Medical): Not on file  Physical Activity:   . Days of Exercise per Week: Not on file  . Minutes of Exercise per Session: Not on file  Stress:   . Feeling of Stress : Not on file  Social Connections:   . Frequency of Communication with Friends and Family: Not on file  . Frequency of Social Gatherings with Friends and Family: Not on file  . Attends Religious Services: Not on file  . Active Member of Clubs or Organizations: Not on file  . Attends Archivist Meetings: Not on file  . Marital Status: Not on file  Intimate Partner Violence:   .  Fear of Current or Ex-Partner: Not on file  . Emotionally Abused: Not on file  . Physically Abused: Not on file  . Sexually Abused: Not on file    Family History  Problem Relation Age of Onset  . Lung cancer Mother   . Colon polyps Brother   . Colon cancer Neg Hx   . Esophageal cancer Neg Hx   . Rectal cancer Neg Hx   . Stomach cancer Neg Hx     Current Outpatient Medications on File Prior to Visit  Medication Sig Dispense Refill  . amoxicillin-clavulanate (AUGMENTIN) 875-125 MG tablet Take 1 tablet by mouth 2 (two) times daily. 20 tablet 0  . aspirin 81 MG chewable tablet Chew 81 mg by mouth daily.     . Blood Pressure KIT 1 kit by Does not apply route in the morning and at bedtime. 1 kit 0  . busPIRone (BUSPAR) 7.5 MG tablet Take 1 tablet (7.5 mg total) by mouth 2 (two) times daily. 60 tablet 5  . Hyprom-Naphaz-Polysorb-Zn Sulf (CLEAR EYES COMPLETE OP) Place 1 drop into both eyes daily as  needed (for dry eyes).     Marland Kitchen lidocaine (LIDODERM) 5 % Place 1 patch onto the skin daily. Remove & Discard patch within 12 hours or as directed by MD (Patient taking differently: Place 1 patch onto the skin as needed. Remove & Discard patch within 12 hours or as directed by MD) 30 patch 0  . Menthol-Methyl Salicylate (MUSCLE RUB) 10-15 % CREA Apply 1 application topically as needed for muscle pain.    . Multiple Vitamin (MULTIVITAMIN WITH MINERALS) TABS tablet Take 1 tablet by mouth daily.    . nitroGLYCERIN (NITROSTAT) 0.4 MG SL tablet Place 1 tablet (0.4 mg total) under the tongue every 5 (five) minutes as needed. 25 tablet 3  . Omega-3 Fatty Acids (FISH OIL) 1000 MG CAPS Take 1,000 mg by mouth daily.    . pantoprazole (PROTONIX) 40 MG tablet Take 1 tablet (40 mg total) by mouth daily. 30 tablet 0  . rosuvastatin (CRESTOR) 5 MG tablet Take 5 mg by mouth daily.    . tamsulosin (FLOMAX) 0.4 MG CAPS capsule Take 1 capsule (0.4 mg total) by mouth daily. 30 capsule 3   No current facility-administered medications on file prior to visit.    Allergies as of 08/15/2020  . (No Known Allergies)     ROS:   General:  No weight loss, Fever, chills  HEENT: No recent headaches, no nasal bleeding, no visual changes, no sore throat  Neurologic: No dizziness, blackouts, seizures. No recent symptoms of stroke or mini- stroke. No recent episodes of slurred speech, or temporary blindness.  Cardiac: No recent episodes of chest pain/pressure, no shortness of breath at rest.  No shortness of breath with exertion.  Denies history of atrial fibrillation or irregular heartbeat  Vascular: No history of rest pain in feet.  No history of claudication.  No history of non-healing ulcer, No history of DVT _0  hx of varicose veins   Pulmonary: No home oxygen, no productive cough, no hemoptysis,  No asthma or wheezing  Musculoskeletal:  _1  Arthritis, _2  Low back pain,  [x ] Joint pain  Hematologic:No history of  hypercoagulable state.  No history of easy bleeding.  No history of anemia  Gastrointestinal: No hematochezia or melena,  No gastroesophageal reflux, no trouble swallowing  Urinary: _3  chronic Kidney disease, _4  on HD - _5  MWF or _6  TTHS, _7   Burning with urination, _0  Frequent urination, _1  Difficulty urinating;   Skin: No rashes  Psychological: No history of anxiety,  No history of depression  Physical Examination  Vitals:   08/15/20 1415  BP: 120/85  Pulse: 73  Resp: 20  Temp: 98.7 F (37.1 C)  TempSrc: Temporal  SpO2: 100%  Weight: 147 lb 8 oz (66.9 kg)  Height: _2  (1.854 m)    Body mass index is 19.46 kg/m.  General:  Alert and oriented, no acute distress HEENT: Normal Neck: No bruit or JVD Pulmonary: Clear to auscultation bilaterally Cardiac: Regular Rate and Rhythm without murmur Abdomen: Soft, non-tender, non-distended, no mass, no scars Skin: No rash, varicose veins B LE Extremity Pulses:  2+ radial, brachial, femoral, dorsalis pedis, posterior tibial pulses bilaterally Musculoskeletal: No deformity or edema  Neurologic: Upper and lower extremity motor 5/5 and symmetric  DATA: Venous Reflux Times  +--------------+---------+------+-----------+------------+--------+  RIGHT     Reflux NoRefluxReflux TimeDiameter cmsComments               Yes                   +--------------+---------+------+-----------+------------+--------+  CFV            yes  >1 second             +--------------+---------+------+-----------+------------+--------+  FV prox                        NV     +--------------+---------+------+-----------+------------+--------+  FV mid                        NV     +--------------+---------+------+-----------+------------+--------+  FV dist                        NV      +--------------+---------+------+-----------+------------+--------+  GSV prox thigh                    NV     +--------------+---------+------+-----------+------------+--------+  GSV mid thigh                     NV     +--------------+---------+------+-----------+------------+--------+  GSV dist thigh                    NV     +--------------+---------+------+-----------+------------+--------+  GSV at knee                  0.20        +--------------+---------+------+-----------+------------+--------+  GSV prox calf                 0.18        +--------------+---------+------+-----------+------------+--------+  GSV mid calf                 0.16        +--------------+---------+------+-----------+------------+--------+  GSV dist calf                 0.25        +--------------+---------+------+-----------+------------+--------+  SSV prox calf                 0.16        +--------------+---------+------+-----------+------------+--------+  SSV mid calf                 0.18        +--------------+---------+------+-----------+------------+--------+  0.15        +--------------+---------+------+-----------+------------+--------+     +--------------+---------+------+-----------+------------+--------+  LEFT     Reflux NoRefluxReflux TimeDiameter cmsComments               Yes                   +--------------+---------+------+-----------+------------+--------+  CFV            yes  >1 second             +--------------+---------+------+-----------+------------+--------+  GSV at SFJ                   0.39  thrombus  +--------------+---------+------+-----------+------------+--------+  GSV prox thigh               0.28 / NV NV     +--------------+---------+------+-----------+------------+--------+  GSV mid thigh                     NV     +--------------+---------+------+-----------+------------+--------+  GSV dist thigh                0.27        +--------------+---------+------+-----------+------------+--------+  GSV at knee                  0.34        +--------------+---------+------+-----------+------------+--------+  GSV prox calf                 0.23        +--------------+---------+------+-----------+------------+--------+  GSV mid calf                 0.23        +--------------+---------+------+-----------+------------+--------+  GSV dist calf                0.22 / NV       +--------------+---------+------+-----------+------------+--------+  SSV Pop Fossa                 0.38        +--------------+---------+------+-----------+------------+--------+  SSV prox calf                 0.39        +--------------+---------+------+-----------+------------+--------+  SSV mid calf          >500 ms   0.31        +--------------+---------+------+-----------+------------+--------+               yes  >500 ms   0.21  PC     +--------------+---------+------+-----------+------------+--------+         Summary:  Right:  - No evidence of acute deep vein thrombosis seen in the right lower  extremity, from the common femoral through the popliteal veins. The  greater saphenous vein appears to be closed.  - Venous reflux is noted in the right common femoral vein.  - Venous reflux is noted in the  distal right short saphenous vein.,  possible ablated.     Left:  - No evidence of acute deep vein thrombosis seen in the left lower  extremity, from the common femoral through the popliteal veins.  - Venous reflux is noted in the left common femoral vein.  - Venous reflux is noted in the left short saphenous vein mid segment,  There appears to be thrombus at the saphenofemoral junction. 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   He denise cellulitis, skin changes or edema on a daily basis.    Left  Venous reflux is noted in the left common femoral vein.   Venous reflux is noted in the right common femoral vein.    History of EHIT with evidence on reflux study: There appears to be thrombus at the saphenofemoral junction. Segments of the greater saphenous vein not visualized, suggestive of ablation  He excellent arterial flow B LE with palpables pedal pulses B.  He is not at risk of limb loss.       Plan:  He will continue to stay active with periodic elevation of his LE.  He should wear his compression if he has to stand or sit for prolonged periods or when traveling.    Roxy Horseman PA-C [facility] [place] Office: [phone]  MD in clinic [place]

## 2020-08-21 ENCOUNTER — Encounter: Payer: Self-pay | Admitting: Gastroenterology

## 2020-08-28 ENCOUNTER — Ambulatory Visit: Payer: 59 | Admitting: Family Medicine

## 2020-08-31 ENCOUNTER — Other Ambulatory Visit: Payer: Self-pay

## 2020-08-31 ENCOUNTER — Ambulatory Visit (INDEPENDENT_AMBULATORY_CARE_PROVIDER_SITE_OTHER): Payer: 59 | Admitting: Family Medicine

## 2020-08-31 ENCOUNTER — Encounter: Payer: Self-pay | Admitting: Family Medicine

## 2020-08-31 DIAGNOSIS — M542 Cervicalgia: Secondary | ICD-10-CM | POA: Diagnosis not present

## 2020-08-31 DIAGNOSIS — M25511 Pain in right shoulder: Secondary | ICD-10-CM | POA: Diagnosis not present

## 2020-08-31 DIAGNOSIS — G8929 Other chronic pain: Secondary | ICD-10-CM | POA: Diagnosis not present

## 2020-08-31 DIAGNOSIS — M25512 Pain in left shoulder: Secondary | ICD-10-CM
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
## 2020-09-04 ENCOUNTER — Encounter: Payer: 59 | Admitting: Gastroenterology

## 2020-09-11 ENCOUNTER — Ambulatory Visit: Payer: 59 | Admitting: Orthopaedic Surgery

## 2020-09-25 ENCOUNTER — Ambulatory Visit (INDEPENDENT_AMBULATORY_CARE_PROVIDER_SITE_OTHER): Payer: 59 | Admitting: Orthopaedic Surgery

## 2020-09-25 ENCOUNTER — Encounter: Payer: Self-pay | Admitting: Orthopaedic Surgery

## 2020-09-25 DIAGNOSIS — M75102 Unspecified rotator cuff tear or rupture of left shoulder, not specified as traumatic: Secondary | ICD-10-CM | POA: Diagnosis not present

## 2020-09-25 DIAGNOSIS — M19012 Primary osteoarthritis, left shoulder: Secondary | ICD-10-CM | POA: Diagnosis not present
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
## 2020-10-08 ENCOUNTER — Telehealth: Payer: Self-pay

## 2020-10-08 NOTE — Telephone Encounter (Signed)
   [facility] Pre-operative Risk Assessment    HEARTCARE STAFF: - Please ensure there is not already an duplicate clearance open for this procedure. - Under Visit Info/Reason for Call, type in Other and utilize the format Clearance MM/DD/YY or Clearance TBD. Do not use dashes or single digits. - If request is for dental extraction, please clarify the # of teeth to be extracted.  Request for surgical clearance:  1. What type of surgery is being performed? Left shoulder arthroscopy   2. When is this surgery scheduled? TBD   3. What type of clearance is required (medical clearance vs. Pharmacy clearance to hold med vs. Both)? Medical  4. Are there any medications that need to be held prior to surgery and how long?   5. Practice name and name of physician performing surgery? [facility]. What is the office phone number? [phone]   7.   What is the office fax number? [phone] Attention Sherrie  8.   Anesthesia type (None, local, MAC, general) ?    Lowella Grip 10/08/2020, 8:57 AM  _________________________________________________________________   (provider comments below)

## 2020-10-08 NOTE — Telephone Encounter (Signed)
Left VM on both numbers. 

## 2020-10-08 NOTE — Telephone Encounter (Signed)
    Pt is calling back to follow up  

## 2020-10-08 NOTE — Telephone Encounter (Signed)
Patient called in regards to voicemail left by Micah Flesher.

## 2020-10-09 ENCOUNTER — Other Ambulatory Visit: Payer: Self-pay

## 2020-10-09 ENCOUNTER — Ambulatory Visit (INDEPENDENT_AMBULATORY_CARE_PROVIDER_SITE_OTHER): Payer: 59 | Admitting: Medical

## 2020-10-09 ENCOUNTER — Ambulatory Visit: Payer: 59 | Admitting: Medical

## 2020-10-09 VITALS — BP 121/80 | HR 87 | Resp 18 | Ht 73.0 in | Wt 147.0 lb

## 2020-10-09 DIAGNOSIS — K2100 Gastro-esophageal reflux disease with esophagitis, without bleeding: Secondary | ICD-10-CM

## 2020-10-09 DIAGNOSIS — N400 Enlarged prostate without lower urinary tract symptoms: Secondary | ICD-10-CM

## 2020-10-09 DIAGNOSIS — M25519 Pain in unspecified shoulder: Secondary | ICD-10-CM | POA: Diagnosis not present

## 2020-10-09 DIAGNOSIS — F419 Anxiety disorder, unspecified: Secondary | ICD-10-CM | POA: Diagnosis not present

## 2020-10-09 DIAGNOSIS — E782 Mixed hyperlipidemia: Secondary | ICD-10-CM

## 2020-10-09 DIAGNOSIS — Z7185 Encounter for immunization safety counseling: Secondary | ICD-10-CM

## 2020-10-09 DIAGNOSIS — K0889 Other specified disorders of teeth and supporting structures: Secondary | ICD-10-CM | POA: Diagnosis not present

## 2020-10-09 MED ORDER — TAMSULOSIN HCL 0.4 MG PO CAPS: 0.4000 mg | ORAL_CAPSULE | Freq: Every day | ORAL | 3 refills | Status: DC

## 2020-10-09 MED ORDER — AMOXICILLIN-POT CLAVULANATE 875-125 MG PO TABS: 1.0000 | ORAL_TABLET | Freq: Two times a day (BID) | ORAL | 0 refills | Status: DC

## 2020-10-09 NOTE — Telephone Encounter (Signed)
   Primary Cardiologist: Thomasene Ripple, DO  Chart reviewed as part of pre-operative protocol coverage. Patient was contacted 10/09/2020 in reference to pre-operative risk assessment for pending surgery as outlined below.  Matthew Gaines was last seen on 08/10/20 by Dr. Servando Salina.  Since that day, Matthew Gaines has done well.  He has had a nonischemic nuclear stress test on 04/26/20 and calcium score of zero on CT scan on 08/08/20. He can complete more than 4.0 METS without angina.   Therefore, based on ACC/AHA guidelines, the patient would be at acceptable risk for the planned procedure without further cardiovascular testing.   The patient was advised that if he develops new symptoms prior to surgery to contact our office to arrange for a follow-up visit, and he verbalized understanding.  I will route this recommendation to the requesting party via Epic fax function and remove from pre-op pool. Please call with questions.  Matthew Rutherford Wesly Whisenant, PA 10/09/2020, 9:03 AM

## 2020-10-09 NOTE — Patient Instructions (Signed)
Tooth pain- Rx agumentin. Please schedule with dentist.  Anxiety- Refilled buspar. Controlled anxiety  Shoulder pain- Surgery planned. Cardiologist cleared for surgery.  Mixed hyperlipidemia- continue crestor.  Vaccine counseling- get covid vaccine since only had one done in March. Double check date of that one vaccine.  [facility]- refilled flomax.  Genella Rife- controlled continue protonix.  Follow up 3 months or as needed

## 2020-10-09 NOTE — Progress Notes (Signed)
Subjective:    Patient ID: Matthew Gaines, male    DOB: 23-Dec-1960, 59 y.o.   MRN: [id number]  HPI  Pt in for evaluation/follow up.  Pt updates me that he still has not seen dentist.  Pt has had some tooth pain recently. I had given information on dentist to see. But he could not get treated. I had sent him in antibiotic. He did not proceed with extraction. Gave 4 number to various free/low cost/sliding scale clnics.  Above is from prior note. Teeth pain appears to improve with antibiotics. Out of all the offices that he contact [facility] seems to be most promising. He is waiting for a date.  Last time gave augmentin. Upper teeth sensitive to hot and cold temperatures.  Pt bp is well controlled. Not on bp meds. Was on b blocker only before procedure.  Pt anxiety is controlled with buspar.  Pt has some left shoulder pain. He will eventually get surgery. Pt has seen cardiologist in the past and has had stress test.  Pt on crestor for hyperlipidemia. Hx of gerd. Controlled on protonix.   Pt still has not gone to appointment for colonoscopy.     Review of Systems  Constitutional: Negative for chills, fatigue and fever.  HENT: Negative for congestion and drooling.   Respiratory: Negative for cough, chest tightness, shortness of breath and wheezing.   Cardiovascular: Negative for chest pain and palpitations.  Gastrointestinal: Positive for abdominal pain.  Genitourinary: Negative for dysuria.  Musculoskeletal: Negative for back pain.       Shoulder pain  Skin: Negative for rash.  Neurological: Negative for dizziness and light-headedness.  Hematological: Negative for adenopathy. Does not bruise/bleed easily.  Psychiatric/Behavioral: The patient is nervous/anxious.     Past Medical History:  Diagnosis Date  . Allergy    Q 2-3 yrs has issues   . Arthritis   . Chest pain 04/25/2020  . Chest pain at rest 04/25/2020  . GERD (gastroesophageal reflux disease)    past hx   .  Hyperlipidemia   . Overactive bladder   . Poor circulation      Social History   Socioeconomic History  . Marital status: Married    Spouse name: Not on file  . Number of children: Not on file  . Years of education: Not on file  . Highest education level: Not on file  Occupational History  . Not on file  Tobacco Use  . Smoking status: Current Some Day Smoker    Types: Cigarettes  . Smokeless tobacco: Never Used  . Tobacco comment: 2-3 a day - some days none   Substance and Sexual Activity  . Alcohol use: Yes    Comment: occ   . Drug use: Never  . Sexual activity: Not on file  Other Topics Concern  . Not on file  Social History Narrative  . Not on file   Social Determinants of Health   Financial Resource Strain:   . Difficulty of Paying Living Expenses: Not on file  Food Insecurity:   . Worried About Charity fundraiser in the Last Year: Not on file  . Ran Out of Food in the Last Year: Not on file  Transportation Needs:   . Lack of Transportation (Medical): Not on file  . Lack of Transportation (Non-Medical): Not on file  Physical Activity:   . Days of Exercise per Week: Not on file  . Minutes of Exercise per Session: Not on file  Stress:   .  Feeling of Stress : Not on file  Social Connections:   . Frequency of Communication with Friends and Family: Not on file  . Frequency of Social Gatherings with Friends and Family: Not on file  . Attends Religious Services: Not on file  . Active Member of Clubs or Organizations: Not on file  . Attends Archivist Meetings: Not on file  . Marital Status: Not on file  Intimate Partner Violence:   . Fear of Current or Ex-Partner: Not on file  . Emotionally Abused: Not on file  . Physically Abused: Not on file  . Sexually Abused: Not on file    Past Surgical History:  Procedure Laterality Date  . CARDIAC CATHETERIZATION  1999  . CARDIAC CATHETERIZATION  1999  . CIRCUMCISION    . CORONARY ANGIOPLASTY WITH STENT  PLACEMENT  1999  . LASER ABLATION OF VASCULAR LESION    . VASCULAR SURGERY      Family History  Problem Relation Age of Onset  . Lung cancer Mother   . Colon polyps Brother   . Colon cancer Neg Hx   . Esophageal cancer Neg Hx   . Rectal cancer Neg Hx   . Stomach cancer Neg Hx     No Known Allergies  Current Outpatient Medications on File Prior to Visit  Medication Sig Dispense Refill  . aspirin 81 MG chewable tablet Chew 81 mg by mouth daily.     . Blood Pressure KIT 1 kit by Does not apply route in the morning and at bedtime. 1 kit 0  . busPIRone (BUSPAR) 7.5 MG tablet Take 1 tablet (7.5 mg total) by mouth 2 (two) times daily. 60 tablet 5  . Hyprom-Naphaz-Polysorb-Zn Sulf (CLEAR EYES COMPLETE OP) Place 1 drop into both eyes daily as needed (for dry eyes).     Marland Kitchen lidocaine (LIDODERM) 5 % Place 1 patch onto the skin daily. Remove & Discard patch within 12 hours or as directed by MD (Patient taking differently: Place 1 patch onto the skin as needed. Remove & Discard patch within 12 hours or as directed by MD) 30 patch 0  . Menthol-Methyl Salicylate (MUSCLE RUB) 10-15 % CREA Apply 1 application topically as needed for muscle pain.    . Multiple Vitamin (MULTIVITAMIN WITH MINERALS) TABS tablet Take 1 tablet by mouth daily.    . Omega-3 Fatty Acids (FISH OIL) 1000 MG CAPS Take 1,000 mg by mouth daily.    . pantoprazole (PROTONIX) 40 MG tablet Take 1 tablet (40 mg total) by mouth daily. 30 tablet 0  . rosuvastatin (CRESTOR) 5 MG tablet Take 5 mg by mouth daily.    . tamsulosin (FLOMAX) 0.4 MG CAPS capsule Take 1 capsule (0.4 mg total) by mouth daily. 30 capsule 3  . amoxicillin-clavulanate (AUGMENTIN) 875-125 MG tablet Take 1 tablet by mouth 2 (two) times daily. (Patient not taking: Reported on 10/09/2020) 20 tablet 0  . nitroGLYCERIN (NITROSTAT) 0.4 MG SL tablet Place 1 tablet (0.4 mg total) under the tongue every 5 (five) minutes as needed. 25 tablet 3   No current facility-administered  medications on file prior to visit.    BP 121/80   Pulse 87   Resp 18   Ht '6\' 1"'  (1.854 m)   Wt 147 lb (66.7 kg)   SpO2 95%   BMI 19.39 kg/m       Objective:   Physical Exam   General Mental Status- Alert. General Appearance- Not in acute distress.   Skin General: Color-  Normal Color. Moisture- Normal Moisture.  Neck Carotid Arteries- Normal color. Moisture- Normal Moisture. No carotid bruits. No JVD.  Chest and Lung Exam Auscultation: Breath Sounds:-Normal.  Cardiovascular Auscultation:Rythm- Regular. Murmurs & Other Heart Sounds:Auscultation of the heart reveals- No Murmurs.  Abdomen Inspection:-Inspeection Normal. Palpation/Percussion:Note:No mass. Palpation and Percussion of the abdomen reveal- Non Tender, Non Distended + BS, no rebound or guarding.   Neurologic Cranial Nerve exam:- CN III-XII intact(No nystagmus), symmetric smile. Strength:- 5/5 equal and symmetric strength both upper and lower extremities.     Assessment & Plan:  Tooth pain- Rx agumentin. Please schedule with dentist.  Anxiety- Refilled buspar. Controlled anxiety  Shoulder pain- Surgery planned. Cardiologist cleared for surgery.  Mixed hyperlipidemia- continue crestor.  Vaccine counseling- get covid vaccine since only had one done in March.   [facility]- refilled flomax.  Jerrye Bushy- controlled continue protonix.  Follow up 3 months or as needed

## 2020-10-11 ENCOUNTER — Telehealth: Payer: Self-pay | Admitting: Cardiology

## 2020-10-11 NOTE — Telephone Encounter (Signed)
Mr

## 2020-10-11 NOTE — Telephone Encounter (Signed)
  MR Dept Received DDS Paperwork to be Completed by Dr. Tomie China to completed. L/M with patient to call Medical Records for further information. Will interoffice paperwork to the [employer]. 10/11/20

## 2020-10-12 ENCOUNTER — Other Ambulatory Visit (HOSPITAL_BASED_OUTPATIENT_CLINIC_OR_DEPARTMENT_OTHER): Payer: Self-pay | Admitting: Internal Medicine

## 2020-10-12 ENCOUNTER — Ambulatory Visit: Payer: 59 | Attending: Internal Medicine

## 2020-10-19 ENCOUNTER — Other Ambulatory Visit: Payer: Self-pay

## 2020-10-19 MED FILL — PFIZER-BIONTECH COVID-19 VA: 30 | 1 days supply | Qty: 0 | Fill #0

## 2020-10-25 ENCOUNTER — Other Ambulatory Visit: Payer: Self-pay

## 2020-10-25 ENCOUNTER — Encounter (HOSPITAL_BASED_OUTPATIENT_CLINIC_OR_DEPARTMENT_OTHER): Payer: Self-pay | Admitting: Orthopaedic Surgery

## 2020-10-26 NOTE — Progress Notes (Signed)

## 2020-10-27 ENCOUNTER — Other Ambulatory Visit (HOSPITAL_COMMUNITY): Payer: 59 | Attending: Orthopaedic Surgery

## 2020-10-29 ENCOUNTER — Other Ambulatory Visit (HOSPITAL_COMMUNITY)
Admission: RE | Admit: 2020-10-29 | Discharge: 2020-10-29 | Disposition: A | Discharge status: Home / Self Care | Payer: 59 | Source: Ambulatory Visit | Attending: Orthopaedic Surgery | Admitting: Orthopaedic Surgery

## 2020-10-29 DIAGNOSIS — Z20822 Contact with and (suspected) exposure to covid-19: Secondary | ICD-10-CM | POA: Insufficient documentation

## 2020-10-29 DIAGNOSIS — Z01812 Encounter for preprocedural laboratory examination: Secondary | ICD-10-CM | POA: Diagnosis present

## 2020-10-29 LAB — SARS CORONAVIRUS 2 (TAT 6-24 HRS): SARS Coronavirus 2: NEGATIVE

## 2020-10-31 ENCOUNTER — Ambulatory Visit (HOSPITAL_BASED_OUTPATIENT_CLINIC_OR_DEPARTMENT_OTHER): Payer: 59 | Admitting: Anesthesiology

## 2020-10-31 ENCOUNTER — Encounter (HOSPITAL_BASED_OUTPATIENT_CLINIC_OR_DEPARTMENT_OTHER): Payer: Self-pay | Admitting: Orthopaedic Surgery

## 2020-10-31 ENCOUNTER — Encounter (HOSPITAL_BASED_OUTPATIENT_CLINIC_OR_DEPARTMENT_OTHER)
Admission: RE | Disposition: A | Discharge status: Home / Self Care | Payer: Self-pay | Source: Home / Self Care | Attending: Orthopaedic Surgery

## 2020-10-31 ENCOUNTER — Other Ambulatory Visit: Payer: Self-pay

## 2020-10-31 ENCOUNTER — Ambulatory Visit (HOSPITAL_BASED_OUTPATIENT_CLINIC_OR_DEPARTMENT_OTHER)
Admission: RE | Admit: 2020-10-31 | Discharge: 2020-10-31 | Disposition: A | Discharge status: Home / Self Care | Payer: 59 | Attending: Orthopaedic Surgery | Admitting: Orthopaedic Surgery

## 2020-10-31 DIAGNOSIS — M75102 Unspecified rotator cuff tear or rupture of left shoulder, not specified as traumatic: Secondary | ICD-10-CM | POA: Insufficient documentation

## 2020-10-31 DIAGNOSIS — M19012 Primary osteoarthritis, left shoulder: Secondary | ICD-10-CM | POA: Diagnosis not present

## 2020-10-31 DIAGNOSIS — M65812 Other synovitis and tenosynovitis, left shoulder: Secondary | ICD-10-CM | POA: Insufficient documentation

## 2020-10-31 DIAGNOSIS — M7542 Impingement syndrome of left shoulder: Secondary | ICD-10-CM | POA: Diagnosis not present

## 2020-10-31 HISTORY — DX: Angina pectoris, unspecified: I20.9

## 2020-10-31 HISTORY — DX: Benign prostatic hyperplasia without lower urinary tract symptoms: N40.0

## 2020-10-31 HISTORY — DX: Peripheral vascular disease, unspecified: I73.9

## 2020-10-31 HISTORY — PX: SHOULDER ARTHROSCOPY WITH BICEPSTENOTOMY: SHX6204

## 2020-10-31 HISTORY — DX: Acute embolism and thrombosis of unspecified deep veins of unspecified lower extremity: I82.409

## 2020-10-31 SURGERY — SHOULDER ARTHROSCOPY WITH SUBACROMIAL DECOMPRESSION AND DISTAL CLAVICLE EXCISION
Anesthesia: General | Site: Shoulder | Laterality: Left

## 2020-10-31 MED ORDER — FENTANYL CITRATE (PF) 100 MCG/2ML IJ SOLN
50.0000 ug | Freq: Once | INTRAMUSCULAR | Status: AC
  Administered 2020-10-31: 50 ug via INTRAVENOUS

## 2020-10-31 MED ORDER — ONDANSETRON HCL 4 MG/2ML IJ SOLN
INTRAMUSCULAR | Status: AC
  Filled 2020-10-31: qty 2

## 2020-10-31 MED ORDER — HYDROMORPHONE HCL 1 MG/ML IJ SOLN: 0.2500 mg | INTRAMUSCULAR | Status: DC | PRN

## 2020-10-31 MED ORDER — PHENYLEPHRINE HCL-NACL 10-0.9 MG/250ML-% IV SOLN
INTRAVENOUS | Status: DC | PRN
  Administered 2020-10-31: 50 ug/min via INTRAVENOUS

## 2020-10-31 MED ORDER — MEPERIDINE HCL 25 MG/ML IJ SOLN
INTRAMUSCULAR | Status: AC
  Filled 2020-10-31: qty 1

## 2020-10-31 MED ORDER — MIDAZOLAM HCL 2 MG/2ML IJ SOLN
1.0000 mg | Freq: Once | INTRAMUSCULAR | Status: AC
  Administered 2020-10-31: 1 mg via INTRAVENOUS

## 2020-10-31 MED ORDER — BUPIVACAINE-EPINEPHRINE (PF) 0.25% -1:200000 IJ SOLN
INTRAMUSCULAR | Status: AC
  Filled 2020-10-31: qty 30

## 2020-10-31 MED ORDER — OXYCODONE-ACETAMINOPHEN 5-325 MG PO TABS
1.0000 | ORAL_TABLET | Freq: Two times a day (BID) | ORAL | 0 refills | Status: DC | PRN
Start: 2020-10-31 — End: 2020-12-04

## 2020-10-31 MED ORDER — CEFAZOLIN SODIUM-DEXTROSE 2-4 GM/100ML-% IV SOLN
2.0000 g | INTRAVENOUS | Status: AC
  Administered 2020-10-31: 2 g via INTRAVENOUS

## 2020-10-31 MED ORDER — BUPIVACAINE LIPOSOME 1.3 % IJ SUSP
INTRAMUSCULAR | Status: DC | PRN
  Administered 2020-10-31: 10 mL via PERINEURAL

## 2020-10-31 MED ORDER — PROPOFOL 10 MG/ML IV BOLUS
INTRAVENOUS | Status: AC
  Filled 2020-10-31: qty 20

## 2020-10-31 MED ORDER — LACTATED RINGERS IV SOLN: INTRAVENOUS | Status: DC

## 2020-10-31 MED ORDER — MEPERIDINE HCL 25 MG/ML IJ SOLN
6.2500 mg | INTRAMUSCULAR | Status: DC | PRN
  Administered 2020-10-31: 6.25 mg via INTRAVENOUS

## 2020-10-31 MED ORDER — ONDANSETRON HCL 4 MG/2ML IJ SOLN
INTRAMUSCULAR | Status: DC | PRN
  Administered 2020-10-31: 4 mg via INTRAVENOUS

## 2020-10-31 MED ORDER — SODIUM CHLORIDE 0.9 % IR SOLN
Status: DC | PRN
  Administered 2020-10-31: 12000 mL

## 2020-10-31 MED ORDER — DEXAMETHASONE SODIUM PHOSPHATE 10 MG/ML IJ SOLN
INTRAMUSCULAR | Status: DC | PRN
  Administered 2020-10-31: 10 mg via INTRAVENOUS

## 2020-10-31 MED ORDER — ONDANSETRON HCL 4 MG PO TABS
4.0000 mg | ORAL_TABLET | Freq: Three times a day (TID) | ORAL | 0 refills | Status: AC | PRN
Start: 2020-10-31 — End: ?

## 2020-10-31 MED ORDER — FENTANYL CITRATE (PF) 100 MCG/2ML IJ SOLN
INTRAMUSCULAR | Status: AC
  Filled 2020-10-31: qty 2

## 2020-10-31 MED ORDER — LIDOCAINE HCL (CARDIAC) PF 100 MG/5ML IV SOSY
PREFILLED_SYRINGE | INTRAVENOUS | Status: DC | PRN
  Administered 2020-10-31: 60 mg via INTRATRACHEAL
  Administered 2020-10-31: 40 mg via INTRATRACHEAL

## 2020-10-31 MED ORDER — AMISULPRIDE (ANTIEMETIC) 5 MG/2ML IV SOLN: 10.0000 mg | Freq: Once | INTRAVENOUS | Status: DC | PRN

## 2020-10-31 MED ORDER — DEXAMETHASONE SODIUM PHOSPHATE 10 MG/ML IJ SOLN
INTRAMUSCULAR | Status: AC
  Filled 2020-10-31: qty 1

## 2020-10-31 MED ORDER — OXYCODONE HCL 5 MG PO TABS: 5.0000 mg | ORAL_TABLET | Freq: Once | ORAL | Status: DC | PRN

## 2020-10-31 MED ORDER — MIDAZOLAM HCL 2 MG/2ML IJ SOLN
INTRAMUSCULAR | Status: AC
  Filled 2020-10-31: qty 2

## 2020-10-31 MED ORDER — BUPIVACAINE HCL (PF) 0.5 % IJ SOLN
INTRAMUSCULAR | Status: DC | PRN
  Administered 2020-10-31: 20 mL via PERINEURAL

## 2020-10-31 MED ORDER — OXYCODONE HCL 5 MG/5ML PO SOLN: 5.0000 mg | Freq: Once | ORAL | Status: DC | PRN

## 2020-10-31 MED ORDER — CEFAZOLIN SODIUM-DEXTROSE 2-4 GM/100ML-% IV SOLN
INTRAVENOUS | Status: AC
  Filled 2020-10-31: qty 100

## 2020-10-31 MED ORDER — PROPOFOL 10 MG/ML IV BOLUS
INTRAVENOUS | Status: DC | PRN
  Administered 2020-10-31: 200 mg via INTRAVENOUS

## 2020-10-31 MED ORDER — LIDOCAINE 2% (20 MG/ML) 5 ML SYRINGE
INTRAMUSCULAR | Status: AC
  Filled 2020-10-31: qty 5

## 2020-10-31 MED ORDER — PROMETHAZINE HCL 25 MG/ML IJ SOLN: 6.2500 mg | INTRAMUSCULAR | Status: DC | PRN

## 2020-10-31 SURGICAL SUPPLY — 65 items
APL SKNCLS STERI-STRIP NONHPOA (GAUZE/BANDAGES/DRESSINGS)
BENZOIN TINCTURE PRP APPL 2/3 (GAUZE/BANDAGES/DRESSINGS) IMPLANT
BLADE EXCALIBUR 4.0X13 (MISCELLANEOUS) IMPLANT
BLADE SURG 15 STRL LF DISP TIS (BLADE) IMPLANT
BLADE SURG 15 STRL SS (BLADE)
BURR OVAL 8 FLU 4.0X13 (MISCELLANEOUS) ×2 IMPLANT
CANNULA 5.75X71 LONG (CANNULA) ×2 IMPLANT
CANNULA SHOULDER 7CM (CANNULA) ×2 IMPLANT
CANNULA TWIST IN 8.25X7CM (CANNULA) IMPLANT
CANNULA TWIST IN 8.25X9CM (CANNULA) IMPLANT
CLSR STERI-STRIP ANTIMIC 1/2X4 (GAUZE/BANDAGES/DRESSINGS) IMPLANT
COOLER ICEMAN CLASSIC (MISCELLANEOUS) ×2 IMPLANT
COVER WAND RF STERILE (DRAPES) IMPLANT
DECANTER SPIKE VIAL GLASS SM (MISCELLANEOUS) IMPLANT
DISSECTOR  3.8MM X 13CM (MISCELLANEOUS) ×1
DISSECTOR 3.8MM X 13CM (MISCELLANEOUS) ×1 IMPLANT
DRAPE IMP U-DRAPE 54X76 (DRAPES) ×2 IMPLANT
DRAPE INCISE IOBAN 66X45 STRL (DRAPES) ×2 IMPLANT
DRAPE STERI 35X30 U-POUCH (DRAPES) ×2 IMPLANT
DRAPE SURG 17X23 STRL (DRAPES) ×2 IMPLANT
DRAPE U-SHAPE 47X51 STRL (DRAPES) ×2 IMPLANT
DRAPE U-SHAPE 76X120 STRL (DRAPES) ×4 IMPLANT
DRSG PAD ABDOMINAL 8X10 ST (GAUZE/BANDAGES/DRESSINGS) ×4 IMPLANT
DURAPREP 26ML APPLICATOR (WOUND CARE) ×4 IMPLANT
ELECT REM PT RETURN 9FT ADLT (ELECTROSURGICAL)
ELECTRODE REM PT RTRN 9FT ADLT (ELECTROSURGICAL) IMPLANT
GAUZE SPONGE 4X4 12PLY STRL (GAUZE/BANDAGES/DRESSINGS) ×4 IMPLANT
GAUZE XEROFORM 1X8 LF (GAUZE/BANDAGES/DRESSINGS) ×2 IMPLANT
GLOVE BIOGEL PI IND STRL 6.5 (GLOVE) ×1 IMPLANT
GLOVE BIOGEL PI IND STRL 7.0 (GLOVE) ×1 IMPLANT
GLOVE BIOGEL PI INDICATOR 6.5 (GLOVE) ×1
GLOVE BIOGEL PI INDICATOR 7.0 (GLOVE) ×1
GLOVE ECLIPSE 6.5 STRL STRAW (GLOVE) ×4 IMPLANT
GLOVE ECLIPSE 7.0 STRL STRAW (GLOVE) ×4 IMPLANT
GLOVE SKINSENSE NS SZ7.5 (GLOVE) ×1
GLOVE SKINSENSE STRL SZ7.5 (GLOVE) ×1 IMPLANT
GLOVE SURG SYN 7.5  E (GLOVE) ×2
GLOVE SURG SYN 7.5 E (GLOVE) ×2 IMPLANT
GOWN STRL REIN XL XLG (GOWN DISPOSABLE) ×2 IMPLANT
GOWN STRL REUS W/ TWL LRG LVL3 (GOWN DISPOSABLE) ×1 IMPLANT
GOWN STRL REUS W/ TWL XL LVL3 (GOWN DISPOSABLE) ×1 IMPLANT
GOWN STRL REUS W/TWL LRG LVL3 (GOWN DISPOSABLE) ×2
GOWN STRL REUS W/TWL XL LVL3 (GOWN DISPOSABLE) ×2
MANIFOLD NEPTUNE II (INSTRUMENTS) ×2 IMPLANT
NEEDLE SCORPION MULTI FIRE (NEEDLE) IMPLANT
PACK ARTHROSCOPY DSU (CUSTOM PROCEDURE TRAY) ×2 IMPLANT
PACK BASIN DAY SURGERY FS (CUSTOM PROCEDURE TRAY) ×2 IMPLANT
PAD COLD SHLDR WRAP-ON (PAD) ×2 IMPLANT
PORT APPOLLO RF 90DEGREE MULTI (SURGICAL WAND) ×2 IMPLANT
SHEET MEDIUM DRAPE 40X70 STRL (DRAPES) ×4 IMPLANT
SLEEVE SCD COMPRESS KNEE MED (MISCELLANEOUS) ×2 IMPLANT
SLING ARM FOAM STRAP LRG (SOFTGOODS) IMPLANT
SUT ETHILON 3 0 PS 1 (SUTURE) ×2 IMPLANT
SUT FIBERWIRE #2 38 T-5 BLUE (SUTURE)
SUT TIGER TAPE 7 IN WHITE (SUTURE) IMPLANT
SUTURE FIBERWR #2 38 T-5 BLUE (SUTURE) IMPLANT
SUTURE TAPE 1.3 40 TPR END (SUTURE) IMPLANT
SUTURE TAPE TIGERLINK 1.3MM BL (SUTURE) IMPLANT
SUTURETAPE 1.3 40 TPR END (SUTURE)
SUTURETAPE TIGERLINK 1.3MM BL (SUTURE)
SYR 50ML LL SCALE MARK (SYRINGE) ×2 IMPLANT
TAPE FIBER 2MM 7IN #2 BLUE (SUTURE) IMPLANT
TOWEL GREEN STERILE FF (TOWEL DISPOSABLE) ×2 IMPLANT
TUBE CONNECTING 20X1/4 (TUBING) ×2 IMPLANT
TUBING ARTHROSCOPY IRRIG 16FT (MISCELLANEOUS) ×2 IMPLANT

## 2020-10-31 NOTE — Anesthesia Procedure Notes (Signed)
Procedure Name: LMA Insertion Date/Time: 10/31/2020 12:30 PM Performed by: Thornell Mule, CRNA Pre-anesthesia Checklist: Patient identified, Emergency Drugs available, Suction available and Patient being monitored Patient Re-evaluated:Patient Re-evaluated prior to induction Oxygen Delivery Method: Circle system utilized Preoxygenation: Pre-oxygenation with 100% oxygen Induction Type: IV induction LMA: LMA inserted LMA Size: 4.0 Number of attempts: 2 Placement Confirmation: positive ETCO2 Tube secured with: Tape Dental Injury: Teeth and Oropharynx as per pre-operative assessment

## 2020-10-31 NOTE — Anesthesia Preprocedure Evaluation (Addendum)
Anesthesia Evaluation  Patient identified by MRN, date of birth, ID band Patient awake    Reviewed: Allergy & Precautions, NPO status , Patient's Chart, lab work & pertinent test results  Airway Mallampati: II  TM Distance: >3 FB Neck ROM: Full    Dental no notable dental hx.    Pulmonary neg pulmonary ROS, Current Smoker and Patient abstained from smoking.,    Pulmonary exam normal breath sounds clear to auscultation       Cardiovascular + angina + CAD and + Peripheral Vascular Disease  Normal cardiovascular exam Rhythm:Regular Rate:Normal     Neuro/Psych negative neurological ROS  negative psych ROS   GI/Hepatic Neg liver ROS, GERD  ,  Endo/Other  negative endocrine ROS  Renal/GU negative Renal ROS  negative genitourinary   Musculoskeletal  (+) Arthritis , Osteoarthritis,    Abdominal   Peds negative pediatric ROS (+)  Hematology negative hematology ROS (+)   Anesthesia Other Findings   Reproductive/Obstetrics negative OB ROS                             Anesthesia Physical Anesthesia Plan  ASA: III  Anesthesia Plan: General   Post-op Pain Management:  Regional for Post-op pain   Induction: Intravenous  PONV Risk Score and Plan: 1 and Ondansetron and Treatment may vary due to age or medical condition  Airway Management Planned: LMA  Additional Equipment:   Intra-op Plan:   Post-operative Plan: Extubation in OR  Informed Consent: I have reviewed the patients History and Physical, chart, labs and discussed the procedure including the risks, benefits and alternatives for the proposed anesthesia with the patient or authorized representative who has indicated his/her understanding and acceptance.     Dental advisory given  Plan Discussed with: CRNA  Anesthesia Plan Comments:         Anesthesia Quick Evaluation

## 2020-10-31 NOTE — Anesthesia Procedure Notes (Signed)
Anesthesia Regional Block: Interscalene brachial plexus block   Pre-Anesthetic Checklist: ,, timeout performed, Correct Patient, Correct Site, Correct Laterality, Correct Procedure, Correct Position, site marked, Risks and benefits discussed,  Surgical consent,  Pre-op evaluation,  At surgeon's request and post-op pain management  Laterality: Left  Prep: chloraprep       Needles:  Injection technique: Single-shot  Needle Type: Stimiplex     Needle Length: 9cm  Needle Gauge: 21     Additional Needles:   Procedures:,,,, ultrasound used (permanent image in chart),,,,  Narrative:  Start time: 10/31/2020 11:27 AM End time: 10/31/2020 11:32 AM Injection made incrementally with aspirations every 5 mL.  Performed by: Personally  Anesthesiologist: Lowella Curb, MD

## 2020-10-31 NOTE — Transfer of Care (Signed)
Immediate Anesthesia Transfer of Care Note  Patient: [place]  Procedure(s) Performed: LEFT SHOULDER ARTHROSCOPY WITH EXTENSIVE DEBRIDEMENT, DISTAL CLAVICLE EXCISION AND SUBACROMIAL DECOMPRESSION (Left Shoulder)  Patient Location: PACU  Anesthesia Type:General  Level of Consciousness: drowsy, patient cooperative and responds to stimulation  Airway & Oxygen Therapy: Patient Spontanous Breathing and Patient connected to face mask oxygen  Post-op Assessment: Report given to RN and Post -op Vital signs reviewed and stable  Post vital signs: Reviewed and stable  Last Vitals:  Vitals Value Taken Time  BP 156/111 10/31/20 1401  Temp    Pulse 58 10/31/20 1402  Resp 14 10/31/20 1402  SpO2 100 % 10/31/20 1402  Vitals shown include unvalidated device data.  Last Pain:  Vitals:   10/31/20 1019  TempSrc: Oral  PainSc: 0-No pain         Complications: No complications documented.

## 2020-10-31 NOTE — Anesthesia Postprocedure Evaluation (Signed)
Anesthesia Post Note  Patient: Matthew Gaines  Procedure(s) Performed: LEFT SHOULDER ARTHROSCOPY WITH EXTENSIVE DEBRIDEMENT, DISTAL CLAVICLE EXCISION AND SUBACROMIAL DECOMPRESSION (Left Shoulder)     Patient location during evaluation: PACU Anesthesia Type: General Level of consciousness: awake and alert Pain management: pain level controlled Vital Signs Assessment: post-procedure vital signs reviewed and stable Respiratory status: spontaneous breathing, nonlabored ventilation and respiratory function stable Cardiovascular status: blood pressure returned to baseline and stable Postop Assessment: no apparent nausea or vomiting Anesthetic complications: no   No complications documented.  Last Vitals:  Vitals:   10/31/20 1445 10/31/20 1502  BP: (!) 149/104 (!) 136/109  Pulse: 72 71  Resp: 17 17  Temp:  (!) 36.4 C  SpO2: 100% 100%    Last Pain:  Vitals:   10/31/20 1502  TempSrc:   PainSc: 0-No pain                 Lowella Curb

## 2020-10-31 NOTE — Op Note (Signed)
   Date of Surgery: 10/31/2020  INDICATIONS: The patient is a 59 year old gentleman with left shoulder pain that has failed conservative treatment;  The patient did consent to the procedure after discussion of the risks and benefits.  PREOPERATIVE DIAGNOSIS:  1.  Left shoulder rotator cuff tendinosis 2.  Left shoulder synovitis 3.  Left shoulder degenerative proximal biceps tendinosis and superior labral tear 4.  Left shoulder AC joint arthrosis 5.  Left shoulder impingement syndrome  POSTOPERATIVE DIAGNOSIS: Same.  PROCEDURE:  1.  Left shoulder arthroscopic distal clavicle excision 2.  Left shoulder arthroscopic extensive debridement of rotator cuff, rotator interval, biceps tendon, labrum, subdeltoid bursa, subacromial bursa 3.  Left shoulder arthroscopic subacromial decompression with acromioplasty and CA ligament release  SURGEON: N. Glee Arvin, M.D.  ASSIST: Starlyn Skeans [place], [place]; necessary for the timely completion of procedure and due to complexity of procedure..  ANESTHESIA:  general, regional  IV FLUIDS AND URINE: See anesthesia.  ESTIMATED BLOOD LOSS: minimal mL.  IMPLANTS: None  COMPLICATIONS: None.  DESCRIPTION OF PROCEDURE: The patient was brought to the operating room and placed supine on the operating table.  The patient had been signed prior to the procedure and this was documented. The patient had the anesthesia placed by the anesthesiologist.  A time-out was performed to confirm that this was the correct patient, site, side and location. The patient did receive antibiotics prior to the incision and was re-dosed during the procedure as needed at indicated intervals.  The patient was then positioned into the beach chair position with all bony prominences well padded and neutral C spine. The patient had the operative extremity prepped and draped in the standard surgical fashion.    Incisions were made for standard shoulder arthroscopy portals.  Diagnostic  shoulder arthroscopy was first performed of the glenohumeral joint.  He had no evidence of chondromalacia.  He did have moderate synovitis in the rotator interval which was gently debrided.  The proximal biceps had mild tendinopathy in the superior labrum had degenerative tearing as well as anterior labrum.  The anterior labrum was gently debrided and a biceps tenotomy was performed to allow the biceps tendon to retract.  The articular surface of the rotator cuff showed minimal tendinopathy.  Arthroscope was then repositioned into the subacromial space and a subdeltoid and subacromial bursectomy was performed using oscillating shaver and cautery.  Subacromial decompression along with acromioplasty and CA ligament release was performed.  We then identified the distal clavicle and AC joint and a distal clavicle excision was performed using a high-speed bur.  About 1 mm of the acromion was resected with a high-speed bur as well.  The bursal surface of the rotator cuff demonstrated inflamed tissue and moderate tendinosis.  This was all gently debrided using oscillating shaver.  Excess fluid was removed from the surgical sites.  Incisions closed with interrupted nylon sutures.  Sterile dressings were applied.  Patient tolerated procedure well had no me complications.  POSTOPERATIVE PLAN: Sling for comfort.  Discharge home.  Weight-bear as tolerated.  Range of motion and activity as tolerated.  Mayra Reel, MD [facility] [phone] 1:48 PM

## 2020-10-31 NOTE — Progress Notes (Signed)
Assisted Dr. Miller with left, ultrasound guided, interscalene  block. Side rails up, monitors on throughout procedure. See vital signs in flow sheet. Tolerated Procedure well.  

## 2020-10-31 NOTE — H&P (Signed)
PREOPERATIVE H&P  Chief Complaint: left shoulder acromioclavicular joint arthrosis, rotator cuff syndrome  HPI: Matthew Gaines is a 59 y.o. male who presents for surgical treatment of left shoulder acromioclavicular joint arthrosis, rotator cuff syndrome.  He denies any changes in medical history.  Past Medical History:  Diagnosis Date  . Allergy    Q 2-3 yrs has issues   . Anginal pain ([place])   . Arthritis   . BPH (benign prostatic hyperplasia)   . Chest pain 04/25/2020  . Chest pain at rest 04/25/2020  . DVT of lower limb, acute ([place])   . GERD (gastroesophageal reflux disease)    past hx   . Hyperlipidemia   . Overactive bladder   . Poor circulation   . PVD (peripheral vascular disease) ([place])    Past Surgical History:  Procedure Laterality Date  . CARDIAC CATHETERIZATION  1999  . CARDIAC CATHETERIZATION  1999  . CIRCUMCISION    . CORONARY ANGIOPLASTY WITH STENT PLACEMENT  1999  . LASER ABLATION OF VASCULAR LESION    . VASCULAR SURGERY     Social History   Socioeconomic History  . Marital status: Married    Spouse name: Not on file  . Number of children: Not on file  . Years of education: Not on file  . Highest education level: Not on file  Occupational History  . Not on file  Tobacco Use  . Smoking status: Current Some Day Smoker    Types: Cigarettes  . Smokeless tobacco: Never Used  . Tobacco comment: 2-3 a day - some days none   Substance and Sexual Activity  . Alcohol use: Yes    Comment: occ   . Drug use: Never  . Sexual activity: Not on file  Other Topics Concern  . Not on file  Social History Narrative  . Not on file   Social Determinants of Health   Financial Resource Strain:   . Difficulty of Paying Living Expenses: Not on file  Food Insecurity:   . Worried About Charity fundraiser in the Last Year: Not on file  . Ran Out of Food in the Last Year: Not on file  Transportation Needs:   . Lack of Transportation (Medical): Not on file  . Lack  of Transportation (Non-Medical): Not on file  Physical Activity:   . Days of Exercise per Week: Not on file  . Minutes of Exercise per Session: Not on file  Stress:   . Feeling of Stress : Not on file  Social Connections:   . Frequency of Communication with Friends and Family: Not on file  . Frequency of Social Gatherings with Friends and Family: Not on file  . Attends Religious Services: Not on file  . Active Member of Clubs or Organizations: Not on file  . Attends Archivist Meetings: Not on file  . Marital Status: Not on file   Family History  Problem Relation Age of Onset  . Lung cancer Mother   . Colon polyps Brother   . Colon cancer Neg Hx   . Esophageal cancer Neg Hx   . Rectal cancer Neg Hx   . Stomach cancer Neg Hx    Allergies  Allergen Reactions  . Robaxin [Methocarbamol] Other (See Comments)    Chest pain   Prior to Admission medications   Medication Sig Start Date End Date Taking? Authorizing Provider  amoxicillin-clavulanate (AUGMENTIN) 875-125 MG tablet Take 1 tablet by mouth 2 (two) times daily. 10/09/20  Yes Saguier, Iris Pert  aspirin 81 MG chewable tablet Chew 81 mg by mouth daily.    Yes [provider]  busPIRone (BUSPAR) 7.5 MG tablet Take 1 tablet (7.5 mg total) by mouth 2 (two) times daily. 07/09/20  Yes Saguier, Percell Miller, PA-C  Multiple Vitamin (MULTIVITAMIN WITH MINERALS) TABS tablet Take 1 tablet by mouth daily.   Yes [provider]  Omega-3 Fatty Acids (FISH OIL) 1000 MG CAPS Take 1,000 mg by mouth daily.   Yes [provider]  pantoprazole (PROTONIX) 40 MG tablet Take 1 tablet (40 mg total) by mouth daily. 04/27/20  Yes Vann, Jessica U, DO  rosuvastatin (CRESTOR) 5 MG tablet Take 5 mg by mouth daily. 08/10/20  Yes [provider]  tamsulosin (FLOMAX) 0.4 MG CAPS capsule Take 1 capsule (0.4 mg total) by mouth daily. 10/09/20  Yes Saguier, Percell Miller, PA-C  Blood Pressure KIT 1 kit by Does not apply route in the  morning and at bedtime. 06/19/20   Revankar, Reita Cliche, MD  Hyprom-Naphaz-Polysorb-Zn Sulf (CLEAR EYES COMPLETE OP) Place 1 drop into both eyes daily as needed (for dry eyes).     [provider]  lidocaine (LIDODERM) 5 % Place 1 patch onto the skin daily. Remove & Discard patch within 12 hours or as directed by MD Patient taking differently: Place 1 patch onto the skin as needed. Remove & Discard patch within 12 hours or as directed by MD 02/11/20   Joy, Shawn C, PA-C  Menthol-Methyl Salicylate (MUSCLE RUB) 10-15 % CREA Apply 1 application topically as needed for muscle pain.    [provider]  nitroGLYCERIN (NITROSTAT) 0.4 MG SL tablet Place 1 tablet (0.4 mg total) under the tongue every 5 (five) minutes as needed. 05/16/20 08/15/20  Tobb, Kardie, DO     Positive ROS: All other systems have been reviewed and were otherwise negative with the exception of those mentioned in the HPI and as above.  Physical Exam: General: Alert, no acute distress Cardiovascular: No pedal edema Respiratory: No cyanosis, no use of accessory musculature GI: abdomen soft Skin: No lesions in the area of chief complaint Neurologic: Sensation intact distally Psychiatric: Patient is competent for consent with normal mood and affect Lymphatic: no lymphedema  MUSCULOSKELETAL: exam stable  Assessment: left shoulder acromioclavicular joint arthrosis, rotator cuff syndrome  Plan: Plan for Procedure(s): LEFT SHOULDER ARTHROSCOPY WITH EXTENSIVE DEBRIDEMENT, DISTAL CLAVICLE EXCISION AND SUBACROMIAL DECOMPRESSION  The risks benefits and alternatives were discussed with the patient including but not limited to the risks of nonoperative treatment, versus surgical intervention including infection, bleeding, nerve injury,  blood clots, cardiopulmonary complications, morbidity, mortality, among others, and they were willing to proceed.   Preoperative templating of the joint replacement has been completed,  documented, and submitted to the Operating Room personnel in order to optimize intra-operative equipment management.   Eduard Roux, MD 10/31/2020 6:32 AM

## 2020-10-31 NOTE — Discharge Instructions (Signed)
  Post-operative patient instructions  Shoulder Arthroscopy   . Ice:  Place intermittent ice or cooler pack over your shoulder, 30 minutes on and 30 minutes off.  Continue this for the first 72 hours after surgery, then save ice for use after therapy sessions or on more active days.   . Weight:  You may bear weight on your arm as your symptoms allow. . Motion:  Perform gentle shoulder motion as tolerated . Dressing:  Perform 1st dressing change at 2 days postoperative. A moderate amount of blood tinged drainage is to be expected.  So if you bleed through the dressing on the first or second day or if you have fevers, it is fine to change the dressing/check the wounds early and redress wound.  If it bleeds through again, or if the incisions are leaking frank blood, please call the office. May change dressing every 1-2 days thereafter to help watch wounds. Can purchase Tegaderm (or 3M Nexcare) water resistant dressings at local pharmacy / [facility]. . Shower:  Light shower is ok after 2 days.  Please take shower, NO bath. Recover with gauze and ace wrap to help keep wounds protected.   . Pain medication:  A narcotic pain medication has been prescribed.  Take as directed.  Typically you need narcotic pain medication more regularly during the first 3 to 5 days after surgery.  Decrease your use of the medication as the pain improves.  Narcotics can sometimes cause constipation, even after a few doses.  If you have problems with constipation, you can take an over the counter stool softener or light laxative.  If you have persistent problems, please notify your physician's office. . Physical therapy: Additional activity guidelines to be provided by your physician or physical therapist at follow-up visits.  . Driving: Do not recommend driving x 2 weeks post surgical, especially if surgery performed on right side. Should not drive while taking narcotic pain medications. It typically takes at least 2 weeks to  restore sufficient neuromuscular function for normal reaction times for driving safety.  . Call [phone] for questions or problems. Evenings you will be forwarded to the hospital operator.  Ask for the orthopaedic physician on call. Please call if you experience:    o Redness, foul smelling, or persistent drainage from the surgical site  o worsening shoulder pain and swelling not responsive to medication  o any calf pain and or swelling of the lower leg  o temperatures greater than 101.5 F o other questions or concerns   Thank you for allowing us to be a part of your care.    Post Anesthesia Home Care Instructions  Activity: Get plenty of rest for the remainder of the day. A responsible individual must stay with you for 24 hours following the procedure.  For the next 24 hours, DO NOT: -Drive a car -Operate machinery -Drink alcoholic beverages -Take any medication unless instructed by your physician -Make any legal decisions or sign important papers.  Meals: Start with liquid foods such as gelatin or soup. Progress to regular foods as tolerated. Avoid greasy, spicy, heavy foods. If nausea and/or vomiting occur, drink only clear liquids until the nausea and/or vomiting subsides. Call your physician if vomiting continues.  Special Instructions/Symptoms: Your throat may feel dry or sore from the anesthesia or the breathing tube placed in your throat during surgery. If this causes discomfort, gargle with warm salt water. The discomfort should disappear within 24 hours.  If you had a scopolamine patch   behind your ear for the management of post- operative nausea and/or vomiting:  1. The medication in the patch is effective for 72 hours, after which it should be removed.  Wrap patch in a tissue and discard in the trash. Wash hands thoroughly with soap and water. 2. You may remove the patch earlier than 72 hours if you experience unpleasant side effects which may include dry mouth,  dizziness or visual disturbances. 3. Avoid touching the patch. Wash your hands with soap and water after contact with the patch.    Information for Discharge Teaching: EXPAREL (bupivacaine liposome injectable suspension)   Your surgeon or anesthesiologist gave you EXPAREL(bupivacaine) to help control your pain after surgery.   EXPAREL is a local anesthetic that provides pain relief by numbing the tissue around the surgical site.  EXPAREL is designed to release pain medication over time and can control pain for up to 72 hours.  Depending on how you respond to EXPAREL, you may require less pain medication during your recovery.  Possible side effects:  Temporary loss of sensation or ability to move in the area where bupivacaine was injected.  Nausea, vomiting, constipation  Rarely, numbness and tingling in your mouth or lips, lightheadedness, or anxiety may occur.  Call your doctor right away if you think you may be experiencing any of these sensations, or if you have other questions regarding possible side effects.  Follow all other discharge instructions given to you by your surgeon or nurse. Eat a healthy diet and drink plenty of water or other fluids.  If you return to the hospital for any reason within 96 hours following the administration of EXPAREL, it is important for health care providers to know that you have received this anesthetic. A teal colored band has been placed on your arm with the date, time and amount of EXPAREL you have received in order to alert and inform your health care providers. Please leave this armband in place for the full 96 hours following administration, and then you may remove the band.

## 2020-11-01 ENCOUNTER — Telehealth: Payer: Self-pay

## 2020-11-01 ENCOUNTER — Encounter (HOSPITAL_BASED_OUTPATIENT_CLINIC_OR_DEPARTMENT_OTHER): Payer: Self-pay | Admitting: Orthopaedic Surgery

## 2020-11-01 MED ORDER — KETOROLAC TROMETHAMINE 10 MG PO TABS: 10.0000 mg | ORAL_TABLET | Freq: Two times a day (BID) | ORAL | 0 refills | Status: DC | PRN

## 2020-11-01 NOTE — Addendum Note (Signed)
Addended by: Mayra Reel on: 11/01/2020 03:56 PM   Modules accepted: Orders

## 2020-11-01 NOTE — Telephone Encounter (Signed)
I sent in Toradol.

## 2020-11-01 NOTE — Telephone Encounter (Signed)
patient called he stated the oxycodone isnt helping, patient stated he didn't get to talk to Dr.Xu about the result of surgery he would like a call [phone]

## 2020-11-07 ENCOUNTER — Other Ambulatory Visit: Payer: Self-pay

## 2020-11-07 ENCOUNTER — Telehealth: Payer: Self-pay | Admitting: *Deleted

## 2020-11-07 ENCOUNTER — Ambulatory Visit (INDEPENDENT_AMBULATORY_CARE_PROVIDER_SITE_OTHER): Payer: 59 | Admitting: Physician Assistant

## 2020-11-07 ENCOUNTER — Encounter: Payer: Self-pay | Admitting: Orthopaedic Surgery

## 2020-11-07 ENCOUNTER — Inpatient Hospital Stay (HOSPITAL_COMMUNITY): Admission: RE | Admit: 2020-11-07 | Payer: 59 | Source: Ambulatory Visit

## 2020-11-07 DIAGNOSIS — Z9889 Other specified postprocedural states: Secondary | ICD-10-CM

## 2020-11-07 NOTE — Telephone Encounter (Signed)
Pt called asking if he could reschedule his appt for the Venous Doppler that was scehduled today at 345pm fue to death in family and not feeling well. I contacted Neysa Bonito with [facility] and she changed is appt to 11/08/20 at 245pm, pt is aware of appt
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
## 2020-11-08 ENCOUNTER — Ambulatory Visit (HOSPITAL_COMMUNITY)
Admission: RE | Admit: 2020-11-08 | Discharge: 2020-11-08 | Disposition: A | Discharge status: Home / Self Care | Payer: 59 | Source: Ambulatory Visit | Attending: Orthopaedic Surgery | Admitting: Orthopaedic Surgery

## 2020-11-08 ENCOUNTER — Other Ambulatory Visit: Payer: Self-pay | Admitting: Physician Assistant

## 2020-11-08 DIAGNOSIS — Z9889 Other specified postprocedural states: Secondary | ICD-10-CM | POA: Diagnosis not present

## 2020-11-13 ENCOUNTER — Ambulatory Visit (INDEPENDENT_AMBULATORY_CARE_PROVIDER_SITE_OTHER): Payer: 59 | Admitting: Orthopaedic Surgery

## 2020-11-13 DIAGNOSIS — Z9889 Other specified postprocedural states: Secondary | ICD-10-CM

## 2020-11-13 NOTE — Progress Notes (Signed)
Post-Op Visit Note   Patient: Matthew Gaines           Date of Birth: [birth year]           MRN: [id number] Visit Date: 11/13/2020 PCP: Esperanza Richters, PA-C   Assessment & Plan:  Chief Complaint:  Chief Complaint  Patient presents with  . Left Shoulder - Follow-up   Visit Diagnoses:  1. S/P arthroscopy of left shoulder     Plan: Patient is a 59 year old gentleman who comes in today to discuss ultrasound results of the left upper extremity.  He underwent left shoulder arthroscopic debridement, distal clavicle excision and biceps tenotomy about 2 weeks ago.  He was seen in our office 1 week postop where he had a pretty noticeable Popeye deformity that was also very tender.  He also had a history of previous DVTs and therefore venous Doppler ultrasound was ordered to rule out thrombus.  Venous ultrasound left upper extremity was negative for DVT.  This was discussed thoroughly with the patient today.  At this point, he will continue with his physical therapy regimen.  He will follow up with [place] in 4 weeks time when he is 6 weeks out from surgery.  Call with concerns or questions in meantime.  Follow-Up Instructions: Return in about 4 weeks (around 12/11/2020).   Orders:  No orders of the defined types were placed in this encounter.  No orders of the defined types were placed in this encounter.   Imaging: No new imaging  PMFS History: Patient Active Problem List   Diagnosis Date Noted  . Rotator cuff syndrome of left shoulder 09/25/2020  . 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/2020  . Angina pectoris (HCC) 06/15/2020  . Coronary artery disease involving native coronary artery of native heart with unstable angina pectoris (HCC) 05/16/2020  . Mixed hyperlipidemia 05/16/2020  . Chest pain at rest 04/25/2020  . Chest pain 04/25/2020   Past Medical History:  Diagnosis Date  . Allergy    Q 2-3 yrs has issues   . Anginal pain (HCC)   . Arthritis   . BPH (benign  prostatic hyperplasia)   . Chest pain 04/25/2020  . Chest pain at rest 04/25/2020  . DVT of lower limb, acute (HCC)   . GERD (gastroesophageal reflux disease)    past hx   . Hyperlipidemia   . Overactive bladder   . Poor circulation   . PVD (peripheral vascular disease) (HCC)     Family History  Problem Relation Age of Onset  . Lung cancer Mother   . Colon polyps Brother   . Colon cancer Neg Hx   . Esophageal cancer Neg Hx   . Rectal cancer Neg Hx   . Stomach cancer Neg Hx     Past Surgical History:  Procedure Laterality Date  . CARDIAC CATHETERIZATION  1999  . CARDIAC CATHETERIZATION  1999  . CIRCUMCISION    . CORONARY ANGIOPLASTY WITH STENT PLACEMENT  1999  . LASER ABLATION OF VASCULAR LESION    . SHOULDER ARTHROSCOPY WITH BICEPSTENOTOMY Left 10/31/2020   Procedure: SHOULDER ARTHROSCOPY WITH BICEPSTENOTOMY;  Surgeon: Tarry Kos, MD;  [facility];  Service: Orthopedics;  Laterality: Left;  [facility]     Social History   Occupational History  . Not on file  Tobacco Use  . Smoking status: Current Some Day Smoker    Types: Cigarettes  . Smokeless tobacco: Never Used  . Tobacco comment: 2-3 a day - some  days none   Substance and Sexual Activity  . Alcohol use: Yes    Comment: occ   . Drug use: Yes    Types: Cocaine    Comment: april 2020  . Sexual activity: Not on file

## 2020-11-20 ENCOUNTER — Other Ambulatory Visit: Payer: Self-pay

## 2020-11-20 ENCOUNTER — Ambulatory Visit: Payer: 59 | Attending: Physician Assistant

## 2020-11-20 DIAGNOSIS — R29898 Other symptoms and signs involving the musculoskeletal system: Secondary | ICD-10-CM | POA: Diagnosis present

## 2020-11-20 DIAGNOSIS — M6281 Muscle weakness (generalized): Secondary | ICD-10-CM | POA: Diagnosis present

## 2020-11-20 DIAGNOSIS — M25612 Stiffness of left shoulder, not elsewhere classified: Secondary | ICD-10-CM | POA: Insufficient documentation

## 2020-11-20 DIAGNOSIS — M25512 Pain in left shoulder: Secondary | ICD-10-CM | POA: Diagnosis not present

## 2020-11-20 DIAGNOSIS — G8929 Other chronic pain: Secondary | ICD-10-CM | POA: Insufficient documentation
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
## 2020-11-30 ENCOUNTER — Ambulatory Visit: Payer: 59 | Admitting: Medical

## 2020-11-30 DIAGNOSIS — Z0289 Encounter for other administrative examinations: Secondary | ICD-10-CM

## 2020-12-04 ENCOUNTER — Other Ambulatory Visit: Payer: Self-pay

## 2020-12-04 ENCOUNTER — Ambulatory Visit: Payer: 59 | Attending: Physician Assistant

## 2020-12-04 ENCOUNTER — Telehealth: Payer: Self-pay | Admitting: Physical Therapy

## 2020-12-04 ENCOUNTER — Ambulatory Visit (INDEPENDENT_AMBULATORY_CARE_PROVIDER_SITE_OTHER): Payer: 59 | Admitting: Medical

## 2020-12-04 ENCOUNTER — Telehealth: Payer: Self-pay | Admitting: Medical

## 2020-12-04 ENCOUNTER — Encounter: Payer: Self-pay | Admitting: Medical

## 2020-12-04 VITALS — BP 119/73 | HR 87 | Resp 18 | Ht 73.0 in | Wt 156.0 lb

## 2020-12-04 DIAGNOSIS — S46212A Strain of muscle, fascia and tendon of other parts of biceps, left arm, initial encounter: Secondary | ICD-10-CM | POA: Diagnosis not present

## 2020-12-04 MED ORDER — HYDROCODONE-ACETAMINOPHEN 5-325 MG PO TABS: 1.0000 | ORAL_TABLET | Freq: Four times a day (QID) | ORAL | 0 refills | Status: DC | PRN

## 2020-12-04 NOTE — Telephone Encounter (Signed)
I messaged on Teams with Ramon Dredge in regards to this and he advised patient while patient was here in the office we could not give him a copy of this as it is not our record to release to him, but a record from Dr. Warren Danes office.  Patient was advised by PCP to contact Dr. Warren Danes office for this summary note.

## 2020-12-04 NOTE — Telephone Encounter (Signed)
Pt wants copy of 11-13-20 summary note from orthopedist clinic. Can our office make copy for him. Will you call and let him know.

## 2020-12-04 NOTE — Patient Instructions (Addendum)
Pt has left  bicep deformity post shoulder surgery. He wants opinion on evaluation and treatment for Popeye deformity.  For pain giving limited number or norco for pain.  Call ortho office to get copy of desired office from there office.  Pt bp is controlled. Continue current meds.  Anxiety moderate controlled. Expresses bit frustration about covid and some frustration about bicep tendon. Continue buspar 7.5 mg twice daily  Follow up date to be determined. Can see you in a week or two if appointment to ortho delayed.

## 2020-12-04 NOTE — Progress Notes (Signed)
Subjective:    Patient ID: Matthew Gaines, male    DOB: 1961/02/06, 59 y.o.   MRN: [id number]  HPI  Pt in with hx of left shoulder arthroscopic surgery. The next day he had some swelling of left bicep area. He had popeye deformity day after the surgery. Following surgery he got progressive swelling and bruising one week later.   Pt day after post op visit he went to [facility] and got [place] to make sure no DVT.   Pt was sent to PT by orthopedist. Pt does not want to do PT but would rather see orthopedist with [facility] for second opinion  Pt has moderate to severe pain in left upper ext. Pain is dull 7/10.    Pt does not want to use oxycodone. He prefers to use norco.     Review of Systems  Constitutional: Negative for chills, fatigue and fever.  Respiratory: Negative for cough, chest tightness, shortness of breath, wheezing and stridor.   Cardiovascular: Negative for chest pain and palpitations.  Gastrointestinal: Negative for abdominal pain, blood in stool, diarrhea and nausea.  Musculoskeletal: Negative for back pain, joint swelling, myalgias and neck stiffness.       Left bicep tender. popeye arm. Pain in arm.  Neurological: Negative for dizziness, seizures, syncope, numbness and headaches.  Hematological: Negative for adenopathy. Does not bruise/bleed easily.  Psychiatric/Behavioral: Negative for behavioral problems and confusion.    Past Medical History:  Diagnosis Date  . Allergy    Q 2-3 yrs has issues   . Anginal pain ([place])   . Arthritis   . BPH (benign prostatic hyperplasia)   . Chest pain 04/25/2020  . Chest pain at rest 04/25/2020  . DVT of lower limb, acute ([place])   . GERD (gastroesophageal reflux disease)    past hx   . Hyperlipidemia   . Overactive bladder   . Poor circulation   . PVD (peripheral vascular disease) ([place])      Social History   Socioeconomic History  . Marital status: Married    Spouse name: Not on file  . Number of children: Not on file  .  Years of education: Not on file  . Highest education level: Not on file  Occupational History  . Not on file  Tobacco Use  . Smoking status: Current Some Day Smoker    Types: Cigarettes  . Smokeless tobacco: Never Used  . Tobacco comment: 2-3 a day - some days none   Substance and Sexual Activity  . Alcohol use: Yes    Comment: occ   . Drug use: Yes    Types: Cocaine    Comment: april 2020  . Sexual activity: Not on file  Other Topics Concern  . Not on file  Social History Narrative  . Not on file   Social Determinants of Health   Financial Resource Strain: Not on file  Food Insecurity: Not on file  Transportation Needs: Not on file  Physical Activity: Not on file  Stress: Not on file  Social Connections: Not on file  Intimate Partner Violence: Not on file    Past Surgical History:  Procedure Laterality Date  . CARDIAC CATHETERIZATION  1999  . CARDIAC CATHETERIZATION  1999  . CIRCUMCISION    . CORONARY ANGIOPLASTY WITH STENT PLACEMENT  1999  . LASER ABLATION OF VASCULAR LESION    . SHOULDER ARTHROSCOPY WITH BICEPSTENOTOMY Left 10/31/2020   Procedure: SHOULDER ARTHROSCOPY WITH BICEPSTENOTOMY;  Surgeon: Leandrew Koyanagi, MD;  Location:  [facility];  Service: Orthopedics;  Laterality: Left;  [facility] VASC[facility] SURGERY      Family History  Problem Relation Age of Onset  . Lung cancer Mother   . Colon polyps Brother   . Colon cancer Neg Hx   . Esophageal cancer Neg Hx   . Rectal cancer Neg Hx   . Stomach cancer Neg Hx     Allergies  Allergen Reactions  . Mobic [Meloxicam]     Chest pain   . Robaxin [Methocarbamol] Other (See Comments)    Chest pain    Current Outpatient Medications on File Prior to Visit  Medication Sig Dispense Refill  . amoxicillin-clavulanate (AUGMENTIN) 875-125 MG tablet Take 1 tablet by mouth 2 (two) times daily. (Patient not taking: Reported on 12/04/2020) 20 tablet 0  . aspirin 81 MG chewable tablet Chew 81 mg by mouth daily.      . Blood Pressure KIT 1 kit by Does not apply route in the morning and at bedtime. 1 kit 0  . busPIRone (BUSPAR) 7.5 MG tablet Take 1 tablet (7.5 mg total) by mouth 2 (two) times daily. 60 tablet 5  . Hyprom-Naphaz-Polysorb-Zn Sulf (CLEAR EYES COMPLETE OP) Place 1 drop into both eyes daily as needed (for dry eyes).     Marland Kitchen ketorolac (TORADOL) 10 MG tablet Take 1 tablet (10 mg total) by mouth 2 (two) times daily as needed. 10 tablet 0  . lidocaine (LIDODERM) 5 % Place 1 patch onto the skin daily. Remove & Discard patch within 12 hours or as directed by MD (Patient taking differently: Place 1 patch onto the skin as needed. Remove & Discard patch within 12 hours or as directed by MD) 30 patch 0  . Menthol-Methyl Salicylate (MUSCLE RUB) 10-15 % CREA Apply 1 application topically as needed for muscle pain.    . Multiple Vitamin (MULTIVITAMIN WITH MINERALS) TABS tablet Take 1 tablet by mouth daily.    . nitroGLYCERIN (NITROSTAT) 0.4 MG SL tablet Place 1 tablet (0.4 mg total) under the tongue every 5 (five) minutes as needed. 25 tablet 3  . Omega-3 Fatty Acids (FISH OIL) 1000 MG CAPS Take 1,000 mg by mouth daily.    . ondansetron (ZOFRAN) 4 MG tablet Take 1-2 tablets (4-8 mg total) by mouth every 8 (eight) hours as needed for nausea or vomiting. 20 tablet 0  . oxyCODONE-acetaminophen (PERCOCET) 5-325 MG tablet Take 1-2 tablets by mouth 2 (two) times daily as needed for severe pain. (Patient not taking: Reported on 11/20/2020) 30 tablet 0  . pantoprazole (PROTONIX) 40 MG tablet Take 1 tablet (40 mg total) by mouth daily. 30 tablet 0  . rosuvastatin (CRESTOR) 5 MG tablet Take 5 mg by mouth daily.    . tamsulosin (FLOMAX) 0.4 MG CAPS capsule Take 1 capsule (0.4 mg total) by mouth daily. 30 capsule 3   No current facility-administered medications on file prior to visit.    BP 119/73   Pulse 87   Resp 18   Ht '6\' 1"'  (1.854 m)   Wt 156 lb (70.8 kg)   SpO2 100%   BMI 20.58 kg/m      Objective:    Physical Exam   General- No acute distress. Pleasant patient. Neck- Full range of motion, no jvd Lungs- Clear, even and unlabored. Heart- regular rate and rhythm. Neurologic- CNII- XII grossly intact.  Left upper arm- left upper arm. Proximal bicep shows Popeye deformity. Pain on range of motion. Hurts so does not demostrate today.  Assessment & Plan:  Pt has left  bicep deformity post shoulder surgery. He wants opinion on evaluation and treatment for Popeye deformity.  For pain giving limited number or norco for pain.  Call ortho office to get copy of desired office from there office.  Pt bp is controlled. Continue current meds.  Anxiety moderate controlled. Expresses bit frustration about covid and some frustration about bicep tendon. Continue buspar 7.5 mg twice daily   Follow up date to be determined. Can see you in a week or two if appointment to ortho delayed.   Mackie Pai, PA-C   Time spent with patient today was  43 minutes which consisted of chart revdiew, discussing diagnosis, work up treatment and documentation.

## 2020-12-04 NOTE — Telephone Encounter (Signed)
Spoke to pt and he reported he came today late and was rescheduled . I informed him the next visit time was 1215     12/11/20

## 2020-12-11 ENCOUNTER — Ambulatory Visit: Payer: 59

## 2020-12-17 ENCOUNTER — Ambulatory Visit: Payer: 59

## 2020-12-18 ENCOUNTER — Telehealth: Payer: Self-pay | Admitting: Medical

## 2020-12-18 MED ORDER — HYDROCODONE-ACETAMINOPHEN 5-325 MG PO TABS: 1.0000 | ORAL_TABLET | Freq: Four times a day (QID) | ORAL | 0 refills | Status: DC | PRN

## 2020-12-18 NOTE — Telephone Encounter (Signed)
Patient states appointment with othro on Thrusday 12/20/2020  Medication: HYDROcodone-acetaminophen (NORCO) 5-325 MG tablet  amoxicillin-clavulanate (AUGMENTIN) 875-125 MG tablet [[id number]]    Has the patient contacted their pharmacy? No. (If no, request that the patient contact the pharmacy for the refill.) (If yes, when and what did the pharmacy advise?)  Preferred Pharmacy (with phone number or street name):  [id number] - [place], [place] - [address] AT [facility] [address] Phone:  [phone]  Fax:  [phone]       Agent: Please be advised that RX refills may take up to 3 business days. We ask that you follow-up with your pharmacy.

## 2020-12-18 NOTE — Telephone Encounter (Addendum)
Requesting: NORCO Contract: n/a UDS: n/a Last Visit:12/04/2020 Next Visit:01/12/2020 Last Refill:12/04/20  Please Advise  12 tab rx norco sent in. I think he may see ortho on Thursday. Advise not to get any narcotic from ortho. Follow up with me early next week want to discuss his level of pain and plan going forward.

## 2020-12-18 NOTE — Telephone Encounter (Signed)
Understand global but pt could still be seen.

## 2020-12-18 NOTE — Telephone Encounter (Signed)
Caller:Angela  [facility]   Call back #  [phone]  Referral has been Decline patient is out side his Global period

## 2020-12-19 NOTE — Telephone Encounter (Signed)
Called both numbers in chart , unable to lvm

## 2020-12-20 ENCOUNTER — Other Ambulatory Visit: Payer: Self-pay | Admitting: Medical

## 2020-12-20 NOTE — Telephone Encounter (Signed)
Called pt and unable to lvm . Pt has follow-up appointment on 1/21

## 2020-12-27 ENCOUNTER — Ambulatory Visit (INDEPENDENT_AMBULATORY_CARE_PROVIDER_SITE_OTHER): Payer: 59 | Admitting: Physician Assistant

## 2020-12-27 ENCOUNTER — Encounter: Payer: Self-pay | Admitting: Physician Assistant

## 2020-12-27 DIAGNOSIS — Z9889 Other specified postprocedural states: Secondary | ICD-10-CM

## 2020-12-27 MED ORDER — HYDROCODONE-ACETAMINOPHEN 5-325 MG PO TABS
1.0000 | ORAL_TABLET | Freq: Two times a day (BID) | ORAL | 0 refills | Status: DC | PRN
Start: 2020-12-27 — End: 2021-05-25

## 2020-12-27 MED ORDER — TIZANIDINE HCL 2 MG PO CAPS: 2.0000 mg | ORAL_CAPSULE | Freq: Two times a day (BID) | ORAL | 0 refills | Status: AC | PRN

## 2020-12-27 NOTE — Progress Notes (Signed)
Post-Op Visit Note   Patient: Matthew Gaines           Date of Birth: [birth year]           MRN: [id number] Visit Date: 12/27/2020 PCP: Esperanza Richters, PA-C   Assessment & Plan:  Chief Complaint:  Chief Complaint  Patient presents with  . Left Shoulder - Pain   Visit Diagnoses:  1. S/P arthroscopy of left shoulder     Plan: Patient is a pleasant 60 year old gentleman who comes in today 8 weeks out left arthroscopic debridement, decompression and biceps tenotomy.  He has been doing okay.  He has been in a moderate amount of pain and has only been to 1 physical therapy session.  Examination of his left shoulder reveals approximately 50% active range of motion.  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  He is neurovascular intact distally.  At this point, of discussed with him the importance of attending physical therapy.  I have refilled his pain medicine to help facilitate with this.  He will follow-up with [place] in 4 weeks time for recheck.  Call with concerns or questions in the meantime.  Follow-Up Instructions: Return in about 4 weeks (around 01/24/2021).   Orders:  Orders Placed This Encounter  Procedures  . Ambulatory referral to Physical Therapy   Meds ordered this encounter  Medications  . HYDROcodone-acetaminophen (NORCO) 5-325 MG tablet    Sig: Take 1-2 tablets by mouth 2 (two) times daily as needed.    Dispense:  20 tablet    Refill:  0  . tizanidine (ZANAFLEX) 2 MG capsule    Sig: Take 1 capsule (2 mg total) by mouth 2 (two) times daily as needed for muscle spasms.    Dispense:  20 capsule    Refill:  0    Imaging: No new imaging  PMFS History: Patient Active Problem List   Diagnosis Date Noted  . Rotator cuff syndrome of left shoulder 09/25/2020  . 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/2020  . Angina pectoris (HCC) 06/15/2020  . Coronary artery disease involving native coronary artery of native heart with unstable angina pectoris (HCC) 05/16/2020  . Mixed  hyperlipidemia 05/16/2020  . Chest pain at rest 04/25/2020  . Chest pain 04/25/2020   Past Medical History:  Diagnosis Date  . Allergy    Q 2-3 yrs has issues   . Anginal pain (H[facility])   . Arthritis   . BPH (benign prostatic hyperplasia)   . Chest pain 04/25/2020  . Chest pain at rest 04/25/2020  . DVT of lower limb, acute (HCC)   . GERD (gastroesophageal reflux disease)    past hx   . Hyperlipidemia   . Overactive bladder   . Poor circulation   . PVD (peripheral vascular disease) (H[facility])     Family History  Problem Relation Age of Onset  . Lung cancer Mother   . Colon polyps Brother   . Colon cancer Neg Hx   . Esophageal cancer Neg Hx   . Rectal cancer Neg Hx   . Stomach cancer Neg Hx     Past Surgical History:  Procedure Laterality Date  . CARDIAC CATHETERIZATION  1999  . CARDIAC CATHETERIZATION  1999  . CIRCUMCISION    . CORONARY ANGIOPLASTY WITH STENT PLACEMENT  1999  . LASER ABLATION OF VASCULAR LESION    . SHOULDER ARTHROSCOPY WITH BICEPSTENOTOMY Left 10/31/2020   Procedure: SHOULDER ARTHROSCOPY WITH BICEPSTENOTOMY;  Surgeon: Tarry Kos, MD;  Location: [facility];  Service: Orthopedics;  Laterality: Left;  [facility] Kitchen VASCULAR SURGERY     Social History   Occupational History  . Not on file  Tobacco Use  . Smoking status: Current Some Day Smoker    Types: Cigarettes  . Smokeless tobacco: Never Used  . Tobacco comment: 2-3 a day - some days none   Substance and Sexual Activity  . Alcohol use: Yes    Comment: occ   . Drug use: Yes    Types: Cocaine    Comment: april 2020  . Sexual activity: Not on file

## 2021-01-03 ENCOUNTER — Other Ambulatory Visit: Payer: Self-pay | Admitting: Medical

## 2021-01-11 ENCOUNTER — Ambulatory Visit: Payer: 59 | Admitting: Medical

## 2021-01-11 DIAGNOSIS — Z0289 Encounter for other administrative examinations: Secondary | ICD-10-CM

## 2021-01-24 ENCOUNTER — Ambulatory Visit: Payer: Self-pay | Admitting: Physician Assistant

## 2021-02-05 ENCOUNTER — Ambulatory Visit: Payer: 59 | Admitting: Medical

## 2021-02-05 DIAGNOSIS — Z0289 Encounter for other administrative examinations: Secondary | ICD-10-CM

## 2021-02-06 ENCOUNTER — Ambulatory Visit (INDEPENDENT_AMBULATORY_CARE_PROVIDER_SITE_OTHER): Payer: 59 | Admitting: Orthopaedic Surgery

## 2021-02-06 ENCOUNTER — Encounter: Payer: Self-pay | Admitting: Orthopaedic Surgery

## 2021-02-06 DIAGNOSIS — M19012 Primary osteoarthritis, left shoulder: Secondary | ICD-10-CM

## 2021-02-06 DIAGNOSIS — Z9889 Other specified postprocedural states: Secondary | ICD-10-CM | POA: Diagnosis not present

## 2021-02-06 MED ORDER — TRAMADOL HCL 50 MG PO TABS
50.0000 mg | ORAL_TABLET | Freq: Every day | ORAL | 0 refills | Status: DC | PRN
Start: 2021-02-06 — End: 2021-05-25

## 2021-02-06 NOTE — Progress Notes (Signed)
Office Visit Note   Patient: Matthew Gaines           Date of Birth: 1961-01-22           MRN: [id number] Visit Date: 02/06/2021              Requested by: Esperanza Richters, PA-C [address] [place],  [place] [address] PCP: Esperanza Richters, PA-C   Assessment & Plan: Visit Diagnoses:  1. S/P arthroscopy of left shoulder   2. Arthrosis of left acromioclavicular joint     Plan: Impression is 3 months status post left shoulder scope biceps tenotomy distal clavicle excision.  I recommend that he see Dr. Prince Rome for an intra-articular steroid injection to help facilitate range of motion.  I reordered outpatient PT tramadol to take as needed.  We had a lengthy discussion to him provide him with reassurance that the Popeye deformity is cosmetic and should not cause any functional deficits.  In my opinion the main issue is the adhesive capsulitis and the lack of physical therapy.  I would like to recheck him in 8 weeks.  Follow-Up Instructions: Return in about 8 weeks (around 04/03/2021).   Orders:  Orders Placed This Encounter  Procedures  . Ambulatory referral to Physical Therapy   Meds ordered this encounter  Medications  . traMADol (ULTRAM) 50 MG tablet    Sig: Take 1-2 tablets (50-100 mg total) by mouth daily as needed.    Dispense:  20 tablet    Refill:  0      Procedures: No procedures performed   Clinical Data: No additional findings.   Subjective: Chief Complaint  Patient presents with  . Left Shoulder - Pain    Matthew Gaines is a little over 3 months status post left shoulder scope biceps tenotomy and debridement and distal clavicle excision.  He has no going to physical therapy and currently not taking any medications.  He has questions and concerns about the Popeye deformity.   Review of Systems   Objective: Vital Signs: There were no vitals taken for this visit.  Physical Exam  Ortho Exam Left shoulder shows full healed surgical scars.  Range of  motion is moderately limited consistent with adhesive capsulitis.  Strength is slightly decreased secondary to pain and guarding and decreased participation.  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  Specialty Comments:  No specialty comments available.  Imaging: No results found.   PMFS History: Patient Active Problem List   Diagnosis Date Noted  . Rotator cuff syndrome of left shoulder 09/25/2020  . 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/2020  . Angina pectoris (HCC) 06/15/2020  . Coronary artery disease involving native coronary artery of native heart with unstable angina pectoris (HCC) 05/16/2020  . Mixed hyperlipidemia 05/16/2020  . Chest pain at rest 04/25/2020  . Chest pain 04/25/2020   Past Medical History:  Diagnosis Date  . Allergy    Q 2-3 yrs has issues   . Anginal pain (HCC)   . Arthritis   . BPH (benign prostatic hyperplasia)   . Chest pain 04/25/2020  . Chest pain at rest 04/25/2020  . DVT of lower limb, acute (HCC)   . GERD (gastroesophageal reflux disease)    past hx   . Hyperlipidemia   . Overactive bladder   . Poor circulation   . PVD (peripheral vascular disease) (HCC)     Family History  Problem Relation Age of Onset  . Lung cancer Mother   . Colon  polyps Brother   . Colon cancer Neg Hx   . Esophageal cancer Neg Hx   . Rectal cancer Neg Hx   . Stomach cancer Neg Hx     Past Surgical History:  Procedure Laterality Date  . CARDIAC CATHETERIZATION  1999  . CARDIAC CATHETERIZATION  1999  . CIRCUMCISION    . CORONARY ANGIOPLASTY WITH STENT PLACEMENT  1999  . LASER ABLATION OF VASCULAR LESION    . SHOULDER ARTHROSCOPY WITH BICEPSTENOTOMY Left 10/31/2020   Procedure: SHOULDER ARTHROSCOPY WITH BICEPSTENOTOMY;  Surgeon: Tarry Kos, MD;  Location: [facility];  Service: Orthopedics;  Laterality: Left;  [facility] Kitchen VASCULAR SURGERY     Social History   Occupational History  . Not on file  Tobacco Use  . Smoking status: Current Some Day  Smoker    Types: Cigarettes  . Smokeless tobacco: Never Used  . Tobacco comment: 2-3 a day - some days none   Substance and Sexual Activity  . Alcohol use: Yes    Comment: occ   . Drug use: Yes    Types: Cocaine    Comment: april 2020  . Sexual activity: Not on file

## 2021-02-12 ENCOUNTER — Ambulatory Visit: Payer: 59 | Admitting: Family Medicine

## 2021-03-14 ENCOUNTER — Encounter: Payer: Self-pay | Admitting: Orthopaedic Surgery

## 2021-03-14 ENCOUNTER — Ambulatory Visit (INDEPENDENT_AMBULATORY_CARE_PROVIDER_SITE_OTHER): Payer: 59 | Admitting: Orthopaedic Surgery

## 2021-03-14 VITALS — Ht 73.0 in | Wt 156.0 lb

## 2021-03-14 DIAGNOSIS — M25512 Pain in left shoulder: Secondary | ICD-10-CM | POA: Diagnosis not present

## 2021-03-14 DIAGNOSIS — G8929 Other chronic pain: Secondary | ICD-10-CM

## 2021-03-14 DIAGNOSIS — M19012 Primary osteoarthritis, left shoulder: Secondary | ICD-10-CM

## 2021-03-14 DIAGNOSIS — Z9889 Other specified postprocedural states: Secondary | ICD-10-CM

## 2021-03-14 MED ORDER — PREDNISONE 10 MG (21) PO TBPK: ORAL_TABLET | ORAL | 0 refills | Status: DC

## 2021-03-14 NOTE — Progress Notes (Signed)
Office Visit Note   Patient: Matthew Gaines           Date of Birth: Jan 22, 1961           MRN: [id number] Visit Date: 03/14/2021              Requested by: Esperanza Richters, PA-C [address] [place],  [place] [address] PCP: Esperanza Richters, PA-C   Assessment & Plan: Visit Diagnoses:  1. Arthrosis of left acromioclavicular joint   2. S/P arthroscopy of left shoulder   3. Chronic left shoulder pain     Plan: Impression is chronic left shoulder pain 27-month status post left shoulder arthroscopy.  He seems to be most symptomatic from the [facility] joint and I do understand why he continues to be so symptomatic from there.  I do feel that we need to get another MRI to evaluate for any structural abnormalities.  In terms of the Popeye deformity he does not really have much symptoms and he is mainly concerned with the appearance but I was able to convince him that it is not worth going back in there to try to correct the Popeye deformity with a tenodesis because it could be fraught with risk.  He is agreeable to leave the Popeye deformity alone.  We will see him back after the MRI.  Prednisone taper was sent in today.  Follow-Up Instructions: Return if symptoms worsen or fail to improve.   Orders:  No orders of the defined types were placed in this encounter.  Meds ordered this encounter  Medications  . predniSONE (STERAPRED UNI-PAK 21 TAB) 10 MG (21) TBPK tablet    Sig: Take as directed    Dispense:  21 tablet    Refill:  0      Procedures: No procedures performed   Clinical Data: No additional findings.   Subjective: Chief Complaint  Patient presents with  . Left Shoulder - Follow-up    S/p left shoulder scope    [name] his about 4 months status post left shoulder scope with biceps tenotomy.  He developed a Popeye deformity as a result.  He states that his pain is mainly centered over the [facility] joint.  He has occasional cramping pain in the biceps he is most  concerned with the appearance of it.  He did not go to physical therapy because he felt like this was not going to be helpful.   Review of Systems  Constitutional: Negative.   All other systems reviewed and are negative.    Objective: Vital Signs: Ht 6\' 1"  (1.854 m)   Wt 156 lb (70.8 kg)   BMI 20.58 kg/m   Physical Exam Vitals and nursing note reviewed.  Constitutional:      Appearance: He is well-developed.  Pulmonary:     Effort: Pulmonary effort is normal.  Abdominal:     Palpations: Abdomen is soft.  Skin:    General: Skin is warm.  Neurological:     Mental Status: He is alert and oriented to person, place, and time.  Psychiatric:        Behavior: Behavior normal.        Thought Content: Thought content normal.        Judgment: Judgment normal.     Ortho Exam Left shoulder shows full healed surgical scars.  He has nearly full passive and active range of motion with mild discomfort.  He does have a Popeye deformity both strength and function are  appropriate.  He has moderate tenderness to the [facility] joint as well as moderate pain with cross body adduction.  Rotator cuff normal to manual muscle testing. Specialty Comments:  No specialty comments available.  Imaging: No results found.   PMFS History: Patient Active Problem List   Diagnosis Date Noted  . Rotator cuff syndrome of left shoulder 09/25/2020  . 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/2020  . Angina pectoris (HCC) 06/15/2020  . Coronary artery disease involving native coronary artery of native heart with unstable angina pectoris (HCC) 05/16/2020  . Mixed hyperlipidemia 05/16/2020  . Chest pain at rest 04/25/2020  . Chest pain 04/25/2020   Past Medical History:  Diagnosis Date  . Allergy    Q 2-3 yrs has issues   . Anginal pain (HCC)   . Arthritis   . BPH (benign prostatic hyperplasia)   . Chest pain 04/25/2020  . Chest pain at rest 04/25/2020  . DVT of lower limb, acute (HCC)   . GERD  (gastroesophageal reflux disease)    past hx   . Hyperlipidemia   . Overactive bladder   . Poor circulation   . PVD (peripheral vascular disease) (HCC)     Family History  Problem Relation Age of Onset  . Lung cancer Mother   . Colon polyps Brother   . Colon cancer Neg Hx   . Esophageal cancer Neg Hx   . Rectal cancer Neg Hx   . Stomach cancer Neg Hx     Past Surgical History:  Procedure Laterality Date  . CARDIAC CATHETERIZATION  1999  . CARDIAC CATHETERIZATION  1999  . CIRCUMCISION    . CORONARY ANGIOPLASTY WITH STENT PLACEMENT  1999  . LASER ABLATION OF VASCULAR LESION    . SHOULDER ARTHROSCOPY WITH BICEPSTENOTOMY Left 10/31/2020   Procedure: SHOULDER ARTHROSCOPY WITH BICEPSTENOTOMY;  Surgeon: Tarry Kos, MD;  Location: [facility];  Service: Orthopedics;  Laterality: Left;  Marland Kitchen VASCULAR SURGERY     Social History   Occupational History  . Not on file  Tobacco Use  . Smoking status: Current Some Day Smoker    Types: Cigarettes  . Smokeless tobacco: Never Used  . Tobacco comment: 2-3 a day - some days none   Substance and Sexual Activity  . Alcohol use: Yes    Comment: occ   . Drug use: Yes    Types: Cocaine    Comment: april 2020  . Sexual activity: Not on file

## 2021-03-15 ENCOUNTER — Other Ambulatory Visit: Payer: Self-pay

## 2021-03-15 DIAGNOSIS — M25512 Pain in left shoulder: Secondary | ICD-10-CM

## 2021-03-15 DIAGNOSIS — G8929 Other chronic pain: Secondary | ICD-10-CM

## 2021-03-19 ENCOUNTER — Telehealth: Payer: Self-pay | Admitting: Orthopaedic Surgery

## 2021-03-19 NOTE — Telephone Encounter (Signed)
Pt called wondering when his MRI was going to be scheduled.He states he has been waiting for a call since last Monday 03/11/2021. Please give him a call back [phone]

## 2021-03-20 NOTE — Telephone Encounter (Signed)
Tried calling pt to advise him [facility] or [facility] has been trying to contact him to schedule appt X2 no vm was set up, I treid calling this am and left vm on home phone to contact me about this

## 2021-03-23 ENCOUNTER — Other Ambulatory Visit: Payer: Self-pay

## 2021-03-23 ENCOUNTER — Ambulatory Visit (HOSPITAL_BASED_OUTPATIENT_CLINIC_OR_DEPARTMENT_OTHER)
Admission: RE | Admit: 2021-03-23 | Discharge: 2021-03-23 | Disposition: A | Discharge status: Home / Self Care | Payer: 59 | Source: Ambulatory Visit | Attending: Orthopaedic Surgery | Admitting: Orthopaedic Surgery

## 2021-03-23 DIAGNOSIS — G8929 Other chronic pain: Secondary | ICD-10-CM | POA: Diagnosis present

## 2021-03-23 DIAGNOSIS — M25512 Pain in left shoulder: Secondary | ICD-10-CM | POA: Diagnosis present

## 2021-03-28 ENCOUNTER — Encounter: Payer: Self-pay | Admitting: Orthopaedic Surgery

## 2021-03-28 ENCOUNTER — Ambulatory Visit (INDEPENDENT_AMBULATORY_CARE_PROVIDER_SITE_OTHER): Payer: 59 | Admitting: Orthopaedic Surgery

## 2021-03-28 ENCOUNTER — Other Ambulatory Visit: Payer: Self-pay

## 2021-03-28 DIAGNOSIS — M19012 Primary osteoarthritis, left shoulder: Secondary | ICD-10-CM

## 2021-03-28 DIAGNOSIS — M75102 Unspecified rotator cuff tear or rupture of left shoulder, not specified as traumatic: Secondary | ICD-10-CM

## 2021-03-28 NOTE — Progress Notes (Signed)
Office Visit Note   Patient: Matthew Gaines           Date of Birth: [birth year]           MRN: [id number] Visit Date: 03/28/2021              Requested by: Esperanza Richters, PA-C [address] [place],  [place] [address] PCP: Esperanza Richters, PA-C   Assessment & Plan: Visit Diagnoses:  1. Arthrosis of left acromioclavicular joint   2. Rotator cuff syndrome of left shoulder     Plan: MRI is consistent with postsurgical changes without any interval abnormalities.  These results were discussed with Matthew Gaines in detail.  Nothing to indicate that he needs surgery.  He is still very nervous/anxious about the Popeye deformity.  Noticed that he only attended 1 session of outpatient physical therapy.  Strongly encouraged him to get back into physical therapy which in my opinion will provide him with the greatest benefit to his shoulder.  He was at least open to the idea of getting back into physical therapy.  He will follow up with [place] in a couple months if he has any continued problems or concerns.  Total face to face encounter time was greater than 25 minutes and over half of this time was spent in counseling and/or coordination of care.  Follow-Up Instructions: Return if symptoms worsen or fail to improve.   Orders:  Orders Placed This Encounter  Procedures  . Ambulatory referral to Physical Therapy   No orders of the defined types were placed in this encounter.     Procedures: No procedures performed   Clinical Data: No additional findings.   Subjective: Chief Complaint  Patient presents with  . Left Shoulder - Pain, Follow-up    Matthew Gaines returns today for follow-up of recent left shoulder MRI.  Denies any changes.   Review of Systems   Objective: Vital Signs: There were no vitals taken for this visit.  Physical Exam  Ortho Exam No changes to the left shoulder exam. Specialty Comments:  No specialty comments available.  Imaging: No results  found.   PMFS History: Patient Active Problem List   Diagnosis Date Noted  . Rotator cuff syndrome of left shoulder 09/25/2020  . 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/2020  . Angina pectoris (HCC) 06/15/2020  . Coronary artery disease involving native coronary artery of native heart with unstable angina pectoris (HCC) 05/16/2020  . Mixed hyperlipidemia 05/16/2020  . Chest pain at rest 04/25/2020  . Chest pain 04/25/2020   Past Medical History:  Diagnosis Date  . Allergy    Q 2-3 yrs has issues   . Anginal pain (HCC)   . Arthritis   . BPH (benign prostatic hyperplasia)   . Chest pain 04/25/2020  . Chest pain at rest 04/25/2020  . DVT of lower limb, acute (HCC)   . GERD (gastroesophageal reflux disease)    past hx   . Hyperlipidemia   . Overactive bladder   . Poor circulation   . PVD (peripheral vascular disease) (HCC)     Family History  Problem Relation Age of Onset  . Lung cancer Mother   . Colon polyps Brother   . Colon cancer Neg Hx   . Esophageal cancer Neg Hx   . Rectal cancer Neg Hx   . Stomach cancer Neg Hx     Past Surgical History:  Procedure Laterality Date  . CARDIAC CATHETERIZATION  1999  . CARDIAC CATHETERIZATION  1999  . CIRCUMCISION    . CORONARY ANGIOPLASTY WITH STENT PLACEMENT  1999  . LASER ABLATION OF VASCULAR LESION    . SHOULDER ARTHROSCOPY WITH BICEPSTENOTOMY Left 10/31/2020   Procedure: SHOULDER ARTHROSCOPY WITH BICEPSTENOTOMY;  Surgeon: Tarry Kos, MD;  Location: [facility];  Service: Orthopedics;  Laterality: Left;  Marland Kitchen VASCULAR SURGERY     Social History   Occupational History  . Not on file  Tobacco Use  . Smoking status: Current Some Day Smoker    Types: Cigarettes  . Smokeless tobacco: Never Used  . Tobacco comment: 2-3 a day - some days none   Substance and Sexual Activity  . Alcohol use: Yes    Comment: occ   . Drug use: Yes    Types: Cocaine    Comment: april 2020  . Sexual activity: Not on file

## 2021-03-29 ENCOUNTER — Ambulatory Visit: Payer: 59 | Admitting: Physical Therapy

## 2021-03-30 ENCOUNTER — Other Ambulatory Visit (HOSPITAL_BASED_OUTPATIENT_CLINIC_OR_DEPARTMENT_OTHER): Payer: 59

## 2021-04-09 ENCOUNTER — Other Ambulatory Visit: Payer: Self-pay

## 2021-04-09 ENCOUNTER — Encounter: Payer: Self-pay | Admitting: Rehabilitative and Restorative Service Providers"

## 2021-04-09 ENCOUNTER — Ambulatory Visit (INDEPENDENT_AMBULATORY_CARE_PROVIDER_SITE_OTHER): Payer: 59 | Admitting: Rehabilitative and Restorative Service Providers"

## 2021-04-09 DIAGNOSIS — M25512 Pain in left shoulder: Secondary | ICD-10-CM | POA: Diagnosis not present

## 2021-04-09 DIAGNOSIS — R293 Abnormal posture: Secondary | ICD-10-CM

## 2021-04-09 DIAGNOSIS — M25612 Stiffness of left shoulder, not elsewhere classified: Secondary | ICD-10-CM

## 2021-04-09 DIAGNOSIS — M6281 Muscle weakness (generalized): Secondary | ICD-10-CM

## 2021-04-09 DIAGNOSIS — G8929 Other chronic pain: Secondary | ICD-10-CM
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
## 2021-04-16 ENCOUNTER — Encounter: Payer: 59 | Admitting: Physical Therapy

## 2021-04-16 ENCOUNTER — Telehealth: Payer: Self-pay | Admitting: Physical Therapy

## 2021-04-16 NOTE — Telephone Encounter (Signed)
Attempted to call pt as he did not show for PT appt.  Phone rang a few times then went to busy signal.  Unable to leave a message.  Clarita Crane, PT, DPT 04/16/21 11:17 AM

## 2021-04-18 ENCOUNTER — Ambulatory Visit (INDEPENDENT_AMBULATORY_CARE_PROVIDER_SITE_OTHER): Payer: 59 | Admitting: Physical Therapy

## 2021-04-18 ENCOUNTER — Encounter: Payer: 59 | Admitting: Physical Therapy

## 2021-04-18 ENCOUNTER — Encounter: Payer: Self-pay | Admitting: Physical Therapy

## 2021-04-18 ENCOUNTER — Other Ambulatory Visit: Payer: Self-pay

## 2021-04-18 ENCOUNTER — Telehealth: Payer: Self-pay | Admitting: Physical Therapy

## 2021-04-18 DIAGNOSIS — R293 Abnormal posture: Secondary | ICD-10-CM

## 2021-04-18 DIAGNOSIS — R29898 Other symptoms and signs involving the musculoskeletal system: Secondary | ICD-10-CM

## 2021-04-18 DIAGNOSIS — M25512 Pain in left shoulder: Secondary | ICD-10-CM

## 2021-04-18 DIAGNOSIS — M6281 Muscle weakness (generalized): Secondary | ICD-10-CM | POA: Diagnosis not present

## 2021-04-18 DIAGNOSIS — M25612 Stiffness of left shoulder, not elsewhere classified: Secondary | ICD-10-CM

## 2021-04-18 DIAGNOSIS — G8929 Other chronic pain: Secondary | ICD-10-CM

## 2021-04-18 NOTE — Patient Instructions (Signed)
Access Code: [email or web address] URL: [email or web address] Date: 04/18/2021 Prepared by: Moshe Cipro  Exercises Supine Shoulder Flexion with Dowel - 2 x daily - 7 x weekly - 2 sets - 10 reps - 3-5 sec hold Supine Shoulder External Rotation with Dowel - 2 x daily - 7 x weekly - 2 sets - 10 reps - 1-2 sec hold Supine Shoulder Abduction AAROM with Dowel - 2 x daily - 7 x weekly - 2 sets - 10 reps - 1-2 hold Standing Row with Anchored Resistance - 2 x daily - 7 x weekly - 2 sets - 10 reps - 5 sec hold

## 2021-04-18 NOTE — Telephone Encounter (Signed)
Pt NS x 2 for PT appt.  Unsuccessful attempt to reach pt as phone disconnects after a couple rings.  Will keep next scheduled appt and cx remaining per NS policy.  If pt attends next session will allow pt to schedule 1-2 appts at a time.  Clarita Crane, PT, DPT 04/18/21 10:35 AM
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
## 2021-04-23 ENCOUNTER — Ambulatory Visit (INDEPENDENT_AMBULATORY_CARE_PROVIDER_SITE_OTHER): Payer: 59 | Admitting: Physical Therapy

## 2021-04-23 ENCOUNTER — Encounter: Payer: Self-pay | Admitting: Physical Therapy

## 2021-04-23 ENCOUNTER — Other Ambulatory Visit: Payer: Self-pay

## 2021-04-23 DIAGNOSIS — R293 Abnormal posture: Secondary | ICD-10-CM | POA: Diagnosis not present

## 2021-04-23 DIAGNOSIS — M25512 Pain in left shoulder: Secondary | ICD-10-CM

## 2021-04-23 DIAGNOSIS — M6281 Muscle weakness (generalized): Secondary | ICD-10-CM

## 2021-04-23 DIAGNOSIS — M25612 Stiffness of left shoulder, not elsewhere classified: Secondary | ICD-10-CM

## 2021-04-23 DIAGNOSIS — G8929 Other chronic pain: Secondary | ICD-10-CM

## 2021-04-23 DIAGNOSIS — R29898 Other symptoms and signs involving the musculoskeletal system: Secondary | ICD-10-CM
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
## 2021-04-24 ENCOUNTER — Other Ambulatory Visit (HOSPITAL_COMMUNITY): Payer: Self-pay

## 2021-04-25 ENCOUNTER — Encounter: Payer: 59 | Admitting: Rehabilitative and Restorative Service Providers"

## 2021-04-25 ENCOUNTER — Ambulatory Visit (INDEPENDENT_AMBULATORY_CARE_PROVIDER_SITE_OTHER): Payer: 59 | Admitting: Physical Therapy

## 2021-04-25 ENCOUNTER — Other Ambulatory Visit: Payer: Self-pay

## 2021-04-25 DIAGNOSIS — M6281 Muscle weakness (generalized): Secondary | ICD-10-CM

## 2021-04-25 DIAGNOSIS — R293 Abnormal posture: Secondary | ICD-10-CM | POA: Diagnosis not present

## 2021-04-25 DIAGNOSIS — M25612 Stiffness of left shoulder, not elsewhere classified: Secondary | ICD-10-CM | POA: Diagnosis not present

## 2021-04-25 DIAGNOSIS — M25512 Pain in left shoulder: Secondary | ICD-10-CM

## 2021-04-25 DIAGNOSIS — G8929 Other chronic pain: Secondary | ICD-10-CM
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
## 2021-04-30 ENCOUNTER — Ambulatory Visit (INDEPENDENT_AMBULATORY_CARE_PROVIDER_SITE_OTHER): Payer: 59 | Admitting: Rehabilitative and Restorative Service Providers"

## 2021-04-30 ENCOUNTER — Encounter: Payer: Self-pay | Admitting: Rehabilitative and Restorative Service Providers"

## 2021-04-30 ENCOUNTER — Encounter: Payer: 59 | Admitting: Rehabilitative and Restorative Service Providers"

## 2021-04-30 ENCOUNTER — Other Ambulatory Visit: Payer: Self-pay

## 2021-04-30 DIAGNOSIS — M25612 Stiffness of left shoulder, not elsewhere classified: Secondary | ICD-10-CM | POA: Diagnosis not present

## 2021-04-30 DIAGNOSIS — M6281 Muscle weakness (generalized): Secondary | ICD-10-CM

## 2021-04-30 DIAGNOSIS — G8929 Other chronic pain: Secondary | ICD-10-CM

## 2021-04-30 DIAGNOSIS — R293 Abnormal posture: Secondary | ICD-10-CM | POA: Diagnosis not present

## 2021-04-30 DIAGNOSIS — M25512 Pain in left shoulder: Secondary | ICD-10-CM

## 2021-04-30 NOTE — Patient Instructions (Signed)
Access Code: [email or web address] URL: [email or web address] Date: 04/30/2021 Prepared by: Pauletta Browns  Exercises Shoulder External Rotation with Anchored Resistance with Towel Under Elbow - 1 x daily - 7 x weekly - 2-3 sets - 10 reps - 3 seconds hold Standing Scapular Retraction - 5 x daily - 7 x weekly - 1 sets - 5 reps - 5 second hold Supine Scapular Protraction in Flexion with Dumbbells - 1-2 x daily - 7 x weekly - 1 sets - 20 reps - 3 seconds hold Supine Shoulder Internal Rotation Stretch - 1-2 x daily - 7 x weekly - 1 sets - 20 reps - 10 seconds hold Shoulder Internal Rotation with Resistance - 1 x daily - 7 x weekly - 1-2 sets - 10 reps - 3 seconds hold
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
## 2021-05-02 ENCOUNTER — Encounter: Payer: 59 | Admitting: Rehabilitative and Restorative Service Providers"

## 2021-05-03 ENCOUNTER — Ambulatory Visit (INDEPENDENT_AMBULATORY_CARE_PROVIDER_SITE_OTHER): Payer: 59 | Admitting: Rehabilitative and Restorative Service Providers"

## 2021-05-03 ENCOUNTER — Other Ambulatory Visit: Payer: Self-pay

## 2021-05-03 ENCOUNTER — Encounter: Payer: Self-pay | Admitting: Rehabilitative and Restorative Service Providers"

## 2021-05-03 DIAGNOSIS — M25512 Pain in left shoulder: Secondary | ICD-10-CM | POA: Diagnosis not present

## 2021-05-03 DIAGNOSIS — G8929 Other chronic pain: Secondary | ICD-10-CM

## 2021-05-03 DIAGNOSIS — R293 Abnormal posture: Secondary | ICD-10-CM

## 2021-05-03 DIAGNOSIS — M25612 Stiffness of left shoulder, not elsewhere classified: Secondary | ICD-10-CM | POA: Diagnosis not present

## 2021-05-03 DIAGNOSIS — M6281 Muscle weakness (generalized): Secondary | ICD-10-CM | POA: Diagnosis not present

## 2021-05-03 NOTE — Patient Instructions (Signed)
Access Code: [email or web address] URL: [email or web address] Date: 05/03/2021 Prepared by: Chyrel Masson  Exercises Shoulder External Rotation with Anchored Resistance with Towel Under Elbow - 1 x daily - 7 x weekly - 2-3 sets - 10 reps - 3 seconds hold Standing Scapular Retraction - 5 x daily - 7 x weekly - 1 sets - 5 reps - 5 second hold Supine Scapular Protraction in Flexion with Dumbbells - 1-2 x daily - 7 x weekly - 1 sets - 20 reps - 3 seconds hold Supine Shoulder Internal Rotation Stretch - 1-2 x daily - 7 x weekly - 1 sets - 20 reps - 10 seconds hold Shoulder Internal Rotation with Resistance - 1 x daily - 7 x weekly - 1-2 sets - 10 reps - 3 seconds hold Supine Shoulder Flexion Extension Full Range AROM (Mirrored) - 2 x daily - 7 x weekly - 3 sets - 10 reps
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
## 2021-05-07 ENCOUNTER — Encounter: Payer: 59 | Admitting: Physical Therapy

## 2021-05-07 ENCOUNTER — Other Ambulatory Visit: Payer: Self-pay

## 2021-05-07 ENCOUNTER — Encounter: Payer: Self-pay | Admitting: Physical Therapy

## 2021-05-07 ENCOUNTER — Ambulatory Visit (INDEPENDENT_AMBULATORY_CARE_PROVIDER_SITE_OTHER): Payer: 59 | Admitting: Physical Therapy

## 2021-05-07 DIAGNOSIS — R29898 Other symptoms and signs involving the musculoskeletal system: Secondary | ICD-10-CM

## 2021-05-07 DIAGNOSIS — M25512 Pain in left shoulder: Secondary | ICD-10-CM | POA: Diagnosis not present

## 2021-05-07 DIAGNOSIS — M6281 Muscle weakness (generalized): Secondary | ICD-10-CM | POA: Diagnosis not present

## 2021-05-07 DIAGNOSIS — G8929 Other chronic pain: Secondary | ICD-10-CM

## 2021-05-07 DIAGNOSIS — M25612 Stiffness of left shoulder, not elsewhere classified: Secondary | ICD-10-CM | POA: Diagnosis not present

## 2021-05-07 DIAGNOSIS — R293 Abnormal posture: Secondary | ICD-10-CM
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
## 2021-05-09 ENCOUNTER — Telehealth: Payer: Self-pay | Admitting: Physical Therapy

## 2021-05-09 ENCOUNTER — Encounter: Payer: 59 | Admitting: Physical Therapy

## 2021-05-09 ENCOUNTER — Encounter: Payer: 59 | Admitting: Rehabilitative and Restorative Service Providers"

## 2021-05-09 NOTE — Telephone Encounter (Signed)
Pt no show for PT appointment today. They were contacted and informed of this via voicemail. They were provided the date and time of their next appointment on voicemail. They were instructed to call us to let us know if they cannot make their appointment.  Ivery Quale, PT, DPT 05/09/21 10:37 AM

## 2021-05-14 ENCOUNTER — Emergency Department (HOSPITAL_COMMUNITY): Payer: 59

## 2021-05-14 ENCOUNTER — Ambulatory Visit (HOSPITAL_COMMUNITY): Admission: EM | Admit: 2021-05-14 | Discharge: 2021-05-14 | Payer: 59

## 2021-05-14 ENCOUNTER — Telehealth: Payer: Self-pay

## 2021-05-14 ENCOUNTER — Ambulatory Visit (INDEPENDENT_AMBULATORY_CARE_PROVIDER_SITE_OTHER): Payer: 59 | Admitting: Rehabilitative and Restorative Service Providers"

## 2021-05-14 ENCOUNTER — Encounter: Payer: Self-pay | Admitting: Rehabilitative and Restorative Service Providers"

## 2021-05-14 ENCOUNTER — Other Ambulatory Visit: Payer: Self-pay

## 2021-05-14 ENCOUNTER — Emergency Department (HOSPITAL_COMMUNITY)
Admission: EM | Admit: 2021-05-14 | Discharge: 2021-05-14 | Disposition: A | Discharge status: Home / Self Care | Payer: 59 | Attending: Emergency Medicine | Admitting: Emergency Medicine

## 2021-05-14 ENCOUNTER — Encounter: Payer: 59 | Admitting: Physical Therapy

## 2021-05-14 DIAGNOSIS — M25612 Stiffness of left shoulder, not elsewhere classified: Secondary | ICD-10-CM | POA: Diagnosis not present

## 2021-05-14 DIAGNOSIS — S0081XA Abrasion of other part of head, initial encounter: Secondary | ICD-10-CM | POA: Diagnosis not present

## 2021-05-14 DIAGNOSIS — M6281 Muscle weakness (generalized): Secondary | ICD-10-CM | POA: Diagnosis not present

## 2021-05-14 DIAGNOSIS — R1012 Left upper quadrant pain: Secondary | ICD-10-CM | POA: Insufficient documentation

## 2021-05-14 DIAGNOSIS — F17210 Nicotine dependence, cigarettes, uncomplicated: Secondary | ICD-10-CM | POA: Diagnosis not present

## 2021-05-14 DIAGNOSIS — Y92410 Unspecified street and highway as the place of occurrence of the external cause: Secondary | ICD-10-CM | POA: Diagnosis not present

## 2021-05-14 DIAGNOSIS — R0781 Pleurodynia: Secondary | ICD-10-CM | POA: Diagnosis not present

## 2021-05-14 DIAGNOSIS — R293 Abnormal posture: Secondary | ICD-10-CM

## 2021-05-14 DIAGNOSIS — R0682 Tachypnea, not elsewhere classified: Secondary | ICD-10-CM | POA: Diagnosis not present

## 2021-05-14 DIAGNOSIS — I2510 Atherosclerotic heart disease of native coronary artery without angina pectoris: Secondary | ICD-10-CM | POA: Insufficient documentation

## 2021-05-14 DIAGNOSIS — G8929 Other chronic pain: Secondary | ICD-10-CM

## 2021-05-14 DIAGNOSIS — S0990XA Unspecified injury of head, initial encounter: Secondary | ICD-10-CM | POA: Diagnosis not present

## 2021-05-14 DIAGNOSIS — R079 Chest pain, unspecified: Secondary | ICD-10-CM

## 2021-05-14 DIAGNOSIS — M25512 Pain in left shoulder: Secondary | ICD-10-CM | POA: Diagnosis not present

## 2021-05-14 DIAGNOSIS — Z7982 Long term (current) use of aspirin: Secondary | ICD-10-CM | POA: Diagnosis not present

## 2021-05-14 DIAGNOSIS — Z20822 Contact with and (suspected) exposure to covid-19: Secondary | ICD-10-CM | POA: Insufficient documentation

## 2021-05-14 LAB — COMPREHENSIVE METABOLIC PANEL WITH GFR
ALT: 13 U/L (ref 0–44)
AST: 24 U/L (ref 15–41)
Albumin: 3.9 g/dL (ref 3.5–5.0)
Alkaline Phosphatase: 59 U/L (ref 38–126)
Anion gap: 6 (ref 5–15)
BUN: 10 mg/dL (ref 8–23)
CO2: 27 mmol/L (ref 22–32)
Calcium: 9 mg/dL (ref 8.9–10.3)
Chloride: 106 mmol/L (ref 98–111)
Creatinine, Ser: 1.13 mg/dL (ref 0.61–1.24)
GFR, Estimated: >60 mL/min
Glucose, Bld: 91 mg/dL (ref 70–99)
Potassium: 4.6 mmol/L (ref 3.5–5.1)
Sodium: 139 mmol/L (ref 135–145)
Total Bilirubin: 0.7 mg/dL (ref 0.3–1.2)
Total Protein: 6 g/dL — ABNORMAL LOW (ref 6.5–8.1)

## 2021-05-14 LAB — PROTIME-INR
INR: 1 (ref 0.8–1.2)
Prothrombin Time: 12.9 s (ref 11.4–15.2)

## 2021-05-14 LAB — URINALYSIS, ROUTINE W REFLEX MICROSCOPIC
Bilirubin Urine: NEGATIVE
Glucose, UA: 50 mg/dL — AB
Hgb urine dipstick: NEGATIVE
Ketones, ur: NEGATIVE mg/dL
Leukocytes,Ua: NEGATIVE
Nitrite: NEGATIVE
Protein, ur: NEGATIVE mg/dL
Specific Gravity, Urine: 1.017 (ref 1.005–1.030)
pH: 7 (ref 5.0–8.0)

## 2021-05-14 LAB — I-STAT CHEM 8, ED
BUN: 10 mg/dL (ref 8–23)
Calcium, Ion: 1.21 mmol/L (ref 1.15–1.40)
Chloride: 104 mmol/L (ref 98–111)
Creatinine, Ser: 1 mg/dL (ref 0.61–1.24)
Glucose, Bld: 91 mg/dL (ref 70–99)
HCT: 47 % (ref 39.0–52.0)
Hemoglobin: 16 g/dL (ref 13.0–17.0)
Potassium: 4.5 mmol/L (ref 3.5–5.1)
Sodium: 140 mmol/L (ref 135–145)
TCO2: 29 mmol/L (ref 22–32)

## 2021-05-14 LAB — CBC
HCT: 46.1 % (ref 39.0–52.0)
Hemoglobin: 14.9 g/dL (ref 13.0–17.0)
MCH: 30.5 pg (ref 26.0–34.0)
MCHC: 32.3 g/dL (ref 30.0–36.0)
MCV: 94.5 fL (ref 80.0–100.0)
Platelets: 198 10*3/uL (ref 150–400)
RBC: 4.88 MIL/uL (ref 4.22–5.81)
RDW: 14.5 % (ref 11.5–15.5)
WBC: 6.2 10*3/uL (ref 4.0–10.5)
nRBC: 0 % (ref 0.0–0.2)

## 2021-05-14 LAB — SAMPLE TO BLOOD BANK

## 2021-05-14 LAB — RESP PANEL BY RT-PCR (FLU A&B, COVID) ARPGX2
Influenza A by PCR: NEGATIVE
Influenza B by PCR: NEGATIVE
SARS Coronavirus 2 by RT PCR: NEGATIVE

## 2021-05-14 LAB — ETHANOL: Alcohol, Ethyl (B): <10 mg/dL

## 2021-05-14 LAB — TROPONIN I (HIGH SENSITIVITY)
Troponin I (High Sensitivity): <2 ng/L
Troponin I (High Sensitivity): 3 ng/L (ref <18)

## 2021-05-14 LAB — CBG MONITORING, ED: Glucose-Capillary: 82 mg/dL (ref 70–99)

## 2021-05-14 LAB — LACTIC ACID, PLASMA: Lactic Acid, Venous: 2.7 mmol/L (ref 0.5–1.9)

## 2021-05-14 MED ORDER — CYCLOBENZAPRINE HCL 10 MG PO TABS: 5.0000 mg | ORAL_TABLET | Freq: Two times a day (BID) | ORAL | 0 refills | Status: DC | PRN

## 2021-05-14 MED ORDER — HYDROCODONE-ACETAMINOPHEN 5-325 MG PO TABS
1.0000 | ORAL_TABLET | Freq: Once | ORAL | Status: AC
  Administered 2021-05-14: 1 via ORAL
  Filled 2021-05-14: qty 1

## 2021-05-14 MED ORDER — SODIUM CHLORIDE 0.9 % IV BOLUS
1000.0000 mL | Freq: Once | INTRAVENOUS | Status: AC
  Administered 2021-05-14: 1000 mL via INTRAVENOUS

## 2021-05-14 MED ORDER — FENTANYL CITRATE (PF) 100 MCG/2ML IJ SOLN
50.0000 ug | Freq: Once | INTRAMUSCULAR | Status: AC
  Administered 2021-05-14: 50 ug via INTRAVENOUS
  Filled 2021-05-14: qty 2

## 2021-05-14 MED ORDER — CELECOXIB 200 MG PO CAPS: 200.0000 mg | ORAL_CAPSULE | Freq: Two times a day (BID) | ORAL | 0 refills | Status: AC

## 2021-05-14 MED ORDER — DIAZEPAM 5 MG PO TABS
5.0000 mg | ORAL_TABLET | Freq: Once | ORAL | Status: AC
  Administered 2021-05-14: 5 mg via ORAL
  Filled 2021-05-14: qty 1
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
## 2021-05-14 NOTE — Telephone Encounter (Signed)
Called patient to advise that he go to an urgent care. He states that he is currently there now.
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
## 2021-05-14 NOTE — Discharge Instructions (Addendum)

## 2021-05-14 NOTE — ED Notes (Signed)
Pt discharged and wheeled out of the ED in a wheel chair without difficulty. 
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
## 2021-05-14 NOTE — ED Notes (Signed)
CT aware of patient, only 2 scanners running. Will get patient when able.
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
## 2021-05-16 ENCOUNTER — Ambulatory Visit (INDEPENDENT_AMBULATORY_CARE_PROVIDER_SITE_OTHER): Payer: 59 | Admitting: Rehabilitative and Restorative Service Providers"

## 2021-05-16 ENCOUNTER — Encounter: Payer: Self-pay | Admitting: Rehabilitative and Restorative Service Providers"

## 2021-05-16 ENCOUNTER — Encounter: Payer: 59 | Admitting: Rehabilitative and Restorative Service Providers"

## 2021-05-16 ENCOUNTER — Other Ambulatory Visit: Payer: Self-pay

## 2021-05-16 DIAGNOSIS — M6281 Muscle weakness (generalized): Secondary | ICD-10-CM | POA: Diagnosis not present

## 2021-05-16 DIAGNOSIS — M25612 Stiffness of left shoulder, not elsewhere classified: Secondary | ICD-10-CM

## 2021-05-16 DIAGNOSIS — R293 Abnormal posture: Secondary | ICD-10-CM | POA: Diagnosis not present

## 2021-05-16 DIAGNOSIS — G8929 Other chronic pain: Secondary | ICD-10-CM

## 2021-05-16 DIAGNOSIS — M25512 Pain in left shoulder: Secondary | ICD-10-CM | POA: Diagnosis not present
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
## 2021-05-22 ENCOUNTER — Other Ambulatory Visit (HOSPITAL_COMMUNITY): Payer: Self-pay

## 2021-05-22 ENCOUNTER — Telehealth: Payer: Self-pay | Admitting: Cardiology

## 2021-05-22 ENCOUNTER — Telehealth: Payer: Self-pay | Admitting: Medical

## 2021-05-22 MED ORDER — NITROGLYCERIN 0.4 MG SL SUBL: 0.4000 mg | SUBLINGUAL_TABLET | SUBLINGUAL | 3 refills | Status: AC | PRN

## 2021-05-22 NOTE — Telephone Encounter (Signed)
Pt has an appointment on 05/24/2021 , pt can get medication at visit

## 2021-05-22 NOTE — Telephone Encounter (Signed)
Refill sent in per request.  

## 2021-05-22 NOTE — Telephone Encounter (Signed)
Pt, would like a prescription for amoxicillin his having dental procedure   amoxicillin-clavulanate (AUGMENTIN) 875-125 MG tablet [[id number]] DISCONTINUED    Has the patient contacted their pharmacy? no (If no, request that the patient contact the pharmacy for the refill.) (If yes, when and what did the pharmacy advise?)    Preferred Pharmacy (with phone number or street name): [id number] - [place], [place] - [address] AT [facility] [address] Phone:  [phone]  Fax:  [phone]      Agent: Please be advised that RX refills may take up to 3 business days. We ask that you follow-up with your pharmacy.

## 2021-05-22 NOTE — Telephone Encounter (Signed)
    *  STAT* If patient is at the pharmacy, call can be transferred to refill team.   1. Which medications need to be refilled? (please list name of each medication and dose if known)   nitroGLYCERIN (NITROSTAT) 0.4 MG SL tablet    2. Which pharmacy/location (including street and city if local pharmacy) is medication to be sent to? [id number] - [place], [place] - [address]  3. Do they need a 30 day or 90 day supply? 1 bottle

## 2021-05-24 ENCOUNTER — Ambulatory Visit (HOSPITAL_BASED_OUTPATIENT_CLINIC_OR_DEPARTMENT_OTHER)
Admission: RE | Admit: 2021-05-24 | Discharge: 2021-05-24 | Disposition: A | Discharge status: Home / Self Care | Payer: 59 | Source: Ambulatory Visit | Attending: Medical | Admitting: Medical

## 2021-05-24 ENCOUNTER — Ambulatory Visit (INDEPENDENT_AMBULATORY_CARE_PROVIDER_SITE_OTHER): Payer: 59 | Admitting: Medical

## 2021-05-24 ENCOUNTER — Other Ambulatory Visit: Payer: Self-pay

## 2021-05-24 VITALS — BP 102/63 | HR 100 | Temp 97.9°F | Resp 20 | Ht 73.0 in | Wt 154.0 lb

## 2021-05-24 DIAGNOSIS — R911 Solitary pulmonary nodule: Secondary | ICD-10-CM

## 2021-05-24 DIAGNOSIS — K219 Gastro-esophageal reflux disease without esophagitis: Secondary | ICD-10-CM | POA: Insufficient documentation

## 2021-05-24 DIAGNOSIS — N3281 Overactive bladder: Secondary | ICD-10-CM | POA: Insufficient documentation

## 2021-05-24 DIAGNOSIS — M1990 Unspecified osteoarthritis, unspecified site: Secondary | ICD-10-CM | POA: Insufficient documentation

## 2021-05-24 DIAGNOSIS — K0889 Other specified disorders of teeth and supporting structures: Secondary | ICD-10-CM | POA: Diagnosis not present

## 2021-05-24 DIAGNOSIS — T7840XA Allergy, unspecified, initial encounter: Secondary | ICD-10-CM | POA: Insufficient documentation

## 2021-05-24 DIAGNOSIS — R0781 Pleurodynia: Secondary | ICD-10-CM

## 2021-05-24 DIAGNOSIS — E785 Hyperlipidemia, unspecified: Secondary | ICD-10-CM | POA: Insufficient documentation

## 2021-05-24 DIAGNOSIS — I209 Angina pectoris, unspecified: Secondary | ICD-10-CM | POA: Insufficient documentation

## 2021-05-24 DIAGNOSIS — I82409 Acute embolism and thrombosis of unspecified deep veins of unspecified lower extremity: Secondary | ICD-10-CM | POA: Insufficient documentation

## 2021-05-24 DIAGNOSIS — N400 Enlarged prostate without lower urinary tract symptoms: Secondary | ICD-10-CM | POA: Insufficient documentation

## 2021-05-24 DIAGNOSIS — R0989 Other specified symptoms and signs involving the circulatory and respiratory systems: Secondary | ICD-10-CM | POA: Insufficient documentation

## 2021-05-24 DIAGNOSIS — I739 Peripheral vascular disease, unspecified: Secondary | ICD-10-CM | POA: Insufficient documentation

## 2021-05-24 MED ORDER — AMOXICILLIN-POT CLAVULANATE 875-125 MG PO TABS: 1.0000 | ORAL_TABLET | Freq: Two times a day (BID) | ORAL | 0 refills | Status: DC

## 2021-05-24 NOTE — Addendum Note (Signed)
Addended by: Gwenevere Abbot on: 05/24/2021 12:14 PM   Modules accepted: Orders
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
## 2021-05-25 ENCOUNTER — Telehealth: Payer: Self-pay | Admitting: Medical

## 2021-05-25 MED ORDER — HYDROCODONE-ACETAMINOPHEN 5-325 MG PO TABS: 1.0000 | ORAL_TABLET | Freq: Four times a day (QID) | ORAL | 0 refills | Status: DC | PRN

## 2021-05-25 NOTE — Telephone Encounter (Signed)
Rx norco sent for rib fracture. 

## 2021-05-28 ENCOUNTER — Ambulatory Visit: Payer: 59 | Admitting: Cardiology

## 2021-06-05 ENCOUNTER — Telehealth: Payer: Self-pay | Admitting: *Deleted

## 2021-06-05 NOTE — Telephone Encounter (Signed)
Caller Name Shylo Dillenbeck Caller Phone Number [phone] Call Type Message Only Information Provided Reason for Call Returning a Call from the Office Initial Comment Caller states that he is returning a call from the office. Additional Comment Office hours provided. Disp. Time Disposition Final User 06/04/2021 5:13:22 PM General Information Provided Yes Perla-Benitez, Ezequiel Essex

## 2021-06-07 ENCOUNTER — Other Ambulatory Visit: Payer: Self-pay

## 2021-06-07 ENCOUNTER — Ambulatory Visit (INDEPENDENT_AMBULATORY_CARE_PROVIDER_SITE_OTHER): Payer: 59 | Admitting: Medical

## 2021-06-07 ENCOUNTER — Ambulatory Visit (HOSPITAL_BASED_OUTPATIENT_CLINIC_OR_DEPARTMENT_OTHER)
Admission: RE | Admit: 2021-06-07 | Discharge: 2021-06-07 | Disposition: A | Discharge status: Home / Self Care | Payer: 59 | Source: Ambulatory Visit | Attending: Medical | Admitting: Medical

## 2021-06-07 VITALS — BP 106/63 | HR 63 | Resp 18 | Ht 73.0 in | Wt 155.0 lb

## 2021-06-07 DIAGNOSIS — S2242XD Multiple fractures of ribs, left side, subsequent encounter for fracture with routine healing: Secondary | ICD-10-CM | POA: Diagnosis not present

## 2021-06-07 DIAGNOSIS — M25532 Pain in left wrist: Secondary | ICD-10-CM | POA: Diagnosis not present

## 2021-06-07 MED ORDER — HYDROCODONE-ACETAMINOPHEN 5-325 MG PO TABS: 1.0000 | ORAL_TABLET | Freq: Four times a day (QID) | ORAL | 0 refills | Status: DC | PRN

## 2021-06-07 MED ORDER — BUSPIRONE HCL 7.5 MG PO TABS: 7.5000 mg | ORAL_TABLET | Freq: Two times a day (BID) | ORAL | 5 refills | Status: AC
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
## 2021-06-07 NOTE — Addendum Note (Signed)
Addended by: Gwenevere Abbot on: 06/07/2021 11:32 AM   Modules accepted: Orders

## 2021-06-07 NOTE — Patient Instructions (Addendum)
For left rib pain with fractures refilling your norco today. Pain may last up to 6-8 weeks post injury date. If pain is decreasing adequately would consider decreasing pain medication to tramadol in about 2 weeks.  For left wrist pain will get xray today.  For anxiety refilling your buspar. With medication anxiety controlled.   Follow up in one month or as needed.

## 2021-06-22 ENCOUNTER — Emergency Department (HOSPITAL_BASED_OUTPATIENT_CLINIC_OR_DEPARTMENT_OTHER): Payer: 59

## 2021-06-22 ENCOUNTER — Other Ambulatory Visit: Payer: Self-pay

## 2021-06-22 ENCOUNTER — Emergency Department (HOSPITAL_BASED_OUTPATIENT_CLINIC_OR_DEPARTMENT_OTHER)
Admission: EM | Admit: 2021-06-22 | Discharge: 2021-06-22 | Disposition: A | Discharge status: Home / Self Care | Payer: 59 | Attending: Emergency Medicine | Admitting: Emergency Medicine

## 2021-06-22 ENCOUNTER — Encounter (HOSPITAL_BASED_OUTPATIENT_CLINIC_OR_DEPARTMENT_OTHER): Payer: Self-pay

## 2021-06-22 DIAGNOSIS — I25118 Atherosclerotic heart disease of native coronary artery with other forms of angina pectoris: Secondary | ICD-10-CM | POA: Insufficient documentation

## 2021-06-22 DIAGNOSIS — F17210 Nicotine dependence, cigarettes, uncomplicated: Secondary | ICD-10-CM | POA: Diagnosis not present

## 2021-06-22 DIAGNOSIS — Z7982 Long term (current) use of aspirin: Secondary | ICD-10-CM | POA: Insufficient documentation

## 2021-06-22 DIAGNOSIS — S0990XA Unspecified injury of head, initial encounter: Secondary | ICD-10-CM | POA: Diagnosis present

## 2021-06-22 DIAGNOSIS — Y92410 Unspecified street and highway as the place of occurrence of the external cause: Secondary | ICD-10-CM | POA: Diagnosis not present

## 2021-06-22 DIAGNOSIS — S62112A Displaced fracture of triquetrum [cuneiform] bone, left wrist, initial encounter for closed fracture: Secondary | ICD-10-CM | POA: Diagnosis not present

## 2021-06-22 DIAGNOSIS — S060X0A Concussion without loss of consciousness, initial encounter: Secondary | ICD-10-CM | POA: Diagnosis not present

## 2021-06-22 DIAGNOSIS — S40811A Abrasion of right upper arm, initial encounter: Secondary | ICD-10-CM | POA: Diagnosis not present

## 2021-06-22 DIAGNOSIS — R6884 Jaw pain: Secondary | ICD-10-CM | POA: Diagnosis not present

## 2021-06-22 DIAGNOSIS — S62115A Nondisplaced fracture of triquetrum [cuneiform] bone, left wrist, initial encounter for closed fracture: Secondary | ICD-10-CM

## 2021-06-22 DIAGNOSIS — Z79899 Other long term (current) drug therapy: Secondary | ICD-10-CM | POA: Diagnosis not present

## 2021-06-22 MED ORDER — HYDROCODONE-ACETAMINOPHEN 5-325 MG PO TABS: 1.0000 | ORAL_TABLET | Freq: Four times a day (QID) | ORAL | 0 refills | Status: DC | PRN

## 2021-06-22 MED ORDER — AMOXICILLIN 500 MG PO CAPS: 500.0000 mg | ORAL_CAPSULE | Freq: Three times a day (TID) | ORAL | 0 refills | Status: DC

## 2021-06-22 NOTE — ED Notes (Signed)
ED Provider at bedside. 
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
## 2021-06-26 ENCOUNTER — Ambulatory Visit (INDEPENDENT_AMBULATORY_CARE_PROVIDER_SITE_OTHER): Payer: 59 | Admitting: Medical

## 2021-06-26 ENCOUNTER — Telehealth: Payer: Self-pay | Admitting: Medical

## 2021-06-26 ENCOUNTER — Ambulatory Visit (HOSPITAL_BASED_OUTPATIENT_CLINIC_OR_DEPARTMENT_OTHER)
Admission: RE | Admit: 2021-06-26 | Discharge: 2021-06-26 | Disposition: A | Discharge status: Home / Self Care | Payer: 59 | Source: Ambulatory Visit | Attending: Family Medicine | Admitting: Family Medicine

## 2021-06-26 ENCOUNTER — Encounter: Payer: Self-pay | Admitting: Medical

## 2021-06-26 ENCOUNTER — Ambulatory Visit (INDEPENDENT_AMBULATORY_CARE_PROVIDER_SITE_OTHER): Payer: 59 | Admitting: Family Medicine

## 2021-06-26 ENCOUNTER — Other Ambulatory Visit: Payer: Self-pay

## 2021-06-26 ENCOUNTER — Encounter: Payer: Self-pay | Admitting: Family Medicine

## 2021-06-26 VITALS — BP 120/82 | Ht 73.5 in | Wt 160.0 lb

## 2021-06-26 VITALS — BP 140/80 | HR 80 | Resp 18 | Ht 73.0 in | Wt 156.0 lb

## 2021-06-26 DIAGNOSIS — M79672 Pain in left foot: Secondary | ICD-10-CM

## 2021-06-26 DIAGNOSIS — S62115A Nondisplaced fracture of triquetrum [cuneiform] bone, left wrist, initial encounter for closed fracture: Secondary | ICD-10-CM

## 2021-06-26 DIAGNOSIS — S2242XD Multiple fractures of ribs, left side, subsequent encounter for fracture with routine healing: Secondary | ICD-10-CM | POA: Diagnosis not present

## 2021-06-26 DIAGNOSIS — S2242XA Multiple fractures of ribs, left side, initial encounter for closed fracture: Secondary | ICD-10-CM | POA: Insufficient documentation

## 2021-06-26 DIAGNOSIS — S638X2D Sprain of other part of left wrist and hand, subsequent encounter: Secondary | ICD-10-CM | POA: Insufficient documentation

## 2021-06-26 DIAGNOSIS — S060X0A Concussion without loss of consciousness, initial encounter: Secondary | ICD-10-CM

## 2021-06-26 DIAGNOSIS — S9032XA Contusion of left foot, initial encounter: Secondary | ICD-10-CM | POA: Insufficient documentation

## 2021-06-26 DIAGNOSIS — H532 Diplopia: Secondary | ICD-10-CM

## 2021-06-26 DIAGNOSIS — S638X2A Sprain of other part of left wrist and hand, initial encounter: Secondary | ICD-10-CM

## 2021-06-26 DIAGNOSIS — R0781 Pleurodynia: Secondary | ICD-10-CM | POA: Diagnosis not present

## 2021-06-26 DIAGNOSIS — S62116A Nondisplaced fracture of triquetrum [cuneiform] bone, unspecified wrist, initial encounter for closed fracture: Secondary | ICD-10-CM

## 2021-06-26 DIAGNOSIS — K029 Dental caries, unspecified: Secondary | ICD-10-CM

## 2021-06-26 MED ORDER — HYDROCODONE-ACETAMINOPHEN 5-325 MG PO TABS: 1.0000 | ORAL_TABLET | Freq: Four times a day (QID) | ORAL | 0 refills | Status: DC | PRN
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
## 2021-06-26 NOTE — Assessment & Plan Note (Signed)
Appreciated on 7/2.  Does have limited range of motion at the wrist. -Counseled on home exercise therapy and supportive care. -Placed in EXOS cast today  - f/u in one week.
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
## 2021-06-26 NOTE — Telephone Encounter (Signed)
Dr. Jordan Likes,  I saw pt after he saw you. Pt is very anxious about getting results of his foot xray. Looks like it is not read yet. Thought would let you now. Was wondering if you would read or wait until radiologist reads. Thanks,  Ramon Dredge

## 2021-06-26 NOTE — Assessment & Plan Note (Signed)
Has widening on the x-ray. -Counseled on home exercise therapy and supportive care. -Would repeat xray and need to consider further imaging
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
## 2021-06-26 NOTE — Patient Instructions (Signed)
Nice to meet you Please try ice  Please try the rib belt  Please try salon pas for the rib fractures  Please continue the cast.  I will call with the results from today.  Please send me a message in MyChart with any questions or updates.  Please see me back in 1 week.   --Dr. Jordan Likes
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
## 2021-06-27 ENCOUNTER — Telehealth: Payer: Self-pay | Admitting: Medical

## 2021-06-27 ENCOUNTER — Telehealth: Payer: Self-pay | Admitting: Family Medicine

## 2021-06-27 ENCOUNTER — Encounter: Payer: Self-pay | Admitting: Cardiology

## 2021-06-27 ENCOUNTER — Ambulatory Visit (INDEPENDENT_AMBULATORY_CARE_PROVIDER_SITE_OTHER): Payer: 59 | Admitting: Cardiology

## 2021-06-27 VITALS — BP 112/74 | HR 80 | Ht 73.5 in | Wt 159.1 lb

## 2021-06-27 DIAGNOSIS — S060X0A Concussion without loss of consciousness, initial encounter: Secondary | ICD-10-CM

## 2021-06-27 DIAGNOSIS — E782 Mixed hyperlipidemia: Secondary | ICD-10-CM

## 2021-06-27 DIAGNOSIS — S9032XA Contusion of left foot, initial encounter: Secondary | ICD-10-CM

## 2021-06-27 NOTE — Telephone Encounter (Signed)
Unable to leave VM for patient. If he calls back please have him speak with a nurse/CMA and inform that his xray isn't showing changes at the heel. He may have bruised his bone as to why it is painful. We could try some physical therapy or a rolling walking if he's having trouble walking.   If any questions then please take the best time and phone number to call and I will try to call him back.   Myra Rude, MD [facility] 06/27/2021, 8:15 AM

## 2021-06-27 NOTE — Telephone Encounter (Signed)
Patient referred to [facility] sports medicine to see Dr.Zach Katrinka Blazing
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
## 2021-06-27 NOTE — Telephone Encounter (Signed)
Thanks for putting in referral to concussion clinic. Would you mind putting it in again and taking out the one I just signed. Would you copy/paste the description/reason for referral that I placed on referral to neurologist. I want sports med to see pt within a week if possible and want them to be aware of reason for referral.

## 2021-06-27 NOTE — Telephone Encounter (Signed)
Referral note fixed

## 2021-06-27 NOTE — Telephone Encounter (Signed)
Pt informed of below.  He want PT for now and will hold off on the rolling walker for now. He states he has been taking Advil and Tylenol. He is requesting something else to help with pain. Please advise.

## 2021-06-27 NOTE — Telephone Encounter (Signed)
Patient states he wold like HYDROcodone-acetaminophen (NORCO) 5-325 MG tablet  he took it last night and it hleped him sleep. So he would like more pills sent over to pharmacy for him.   Also  [facility] declined his referral and recommends he see a concussion clinic instead

## 2021-06-27 NOTE — Addendum Note (Signed)
Addended by: Maximino Sarin on: 06/27/2021 04:16 PM   Modules accepted: Orders

## 2021-06-27 NOTE — Patient Instructions (Signed)
Medication Instructions:  Your physician recommends that you continue on your current medications as directed. Please refer to the Current Medication list given to you today.  *If you need a refill on your cardiac medications before your next appointment, please call your pharmacy*   Lab Work: None If you have labs (blood work) drawn today and your tests are completely normal, you will receive your results only by: MyChart Message (if you have MyChart) OR A paper copy in the mail If you have any lab test that is abnormal or we need to change your treatment, we will call you to review the results.   Testing/Procedures: None   Follow-Up: At [facility], you and your health needs are our priority.  As part of our continuing mission to provide you with exceptional heart care, we have created designated Provider Care Teams.  These Care Teams include your primary Cardiologist (physician) and Advanced Practice Providers (APPs -  Physician Assistants and Nurse Practitioners) who all work together to provide you with the care you need, when you need it.  We recommend signing up for the patient portal called "MyChart".  Sign up information is provided on this After Visit Summary.  MyChart is used to connect with patients for Virtual Visits (Telemedicine).  Patients are able to view lab/test results, encounter notes, upcoming appointments, etc.  Non-urgent messages can be sent to your provider as well.   To learn more about what you can do with MyChart, go to [email or web address].    Your next appointment:   1 year(s)  The format for your next appointment:   In Person  Provider:   [address] - Kardie Tobb, DO    Other Instructions   
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
## 2021-06-28 MED ORDER — HYDROCODONE-ACETAMINOPHEN 5-325 MG PO TABS: 1.0000 | ORAL_TABLET | Freq: Four times a day (QID) | ORAL | 0 refills | Status: DC | PRN

## 2021-06-28 NOTE — Telephone Encounter (Signed)
Left message for patient to call back to schedule.  °

## 2021-06-28 NOTE — Telephone Encounter (Signed)
Pt called wondering the status on more pian meds

## 2021-06-28 NOTE — Addendum Note (Signed)
Addended by: Gwenevere Abbot on: 06/28/2021 04:44 PM   Modules accepted: Orders

## 2021-06-28 NOTE — Telephone Encounter (Signed)
Unable to leave VM for patient. If he calls back please have him speak with a nurse/CMA and inform that he can try ibuprofen, tylenol and ice for his pain.   If any questions then please take the best time and phone number to call and I will try to call him back.   Myra Rude, MD [facility] 06/28/2021, 1:51 PM

## 2021-07-03 ENCOUNTER — Ambulatory Visit (HOSPITAL_BASED_OUTPATIENT_CLINIC_OR_DEPARTMENT_OTHER)
Admission: RE | Admit: 2021-07-03 | Discharge: 2021-07-03 | Disposition: A | Discharge status: Home / Self Care | Payer: 59 | Source: Ambulatory Visit | Attending: Family Medicine | Admitting: Family Medicine

## 2021-07-03 ENCOUNTER — Ambulatory Visit (INDEPENDENT_AMBULATORY_CARE_PROVIDER_SITE_OTHER): Payer: 59 | Admitting: Family Medicine

## 2021-07-03 ENCOUNTER — Other Ambulatory Visit: Payer: Self-pay

## 2021-07-03 ENCOUNTER — Ambulatory Visit: Payer: Self-pay

## 2021-07-03 ENCOUNTER — Encounter: Payer: Self-pay | Admitting: Family Medicine

## 2021-07-03 VITALS — BP 120/84 | Ht 73.5 in | Wt 159.0 lb

## 2021-07-03 DIAGNOSIS — S42295A Other nondisplaced fracture of upper end of left humerus, initial encounter for closed fracture: Secondary | ICD-10-CM | POA: Diagnosis not present

## 2021-07-03 DIAGNOSIS — S62115D Nondisplaced fracture of triquetrum [cuneiform] bone, left wrist, subsequent encounter for fracture with routine healing: Secondary | ICD-10-CM

## 2021-07-03 DIAGNOSIS — M25512 Pain in left shoulder: Secondary | ICD-10-CM

## 2021-07-03 DIAGNOSIS — S638X2D Sprain of other part of left wrist and hand, subsequent encounter: Secondary | ICD-10-CM | POA: Diagnosis not present

## 2021-07-03 DIAGNOSIS — S638X2A Sprain of other part of left wrist and hand, initial encounter: Secondary | ICD-10-CM | POA: Insufficient documentation

## 2021-07-03 DIAGNOSIS — S62115A Nondisplaced fracture of triquetrum [cuneiform] bone, left wrist, initial encounter for closed fracture: Secondary | ICD-10-CM | POA: Diagnosis present

## 2021-07-03 DIAGNOSIS — S42202A Unspecified fracture of upper end of left humerus, initial encounter for closed fracture: Secondary | ICD-10-CM | POA: Insufficient documentation

## 2021-07-03 NOTE — Patient Instructions (Signed)
Good to see you Please continue the cast  Please call [phone] to schedule the MRI   Please send me a message in MyChart with any questions or updates.  Please see me back in 2 weeks.   --Dr. Jordan Likes
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
## 2021-07-03 NOTE — Assessment & Plan Note (Signed)
Monitoring for widening on x-ray. -X-ray today. -Could consider further imaging or occupational therapy.

## 2021-07-05 ENCOUNTER — Telehealth: Payer: Self-pay | Admitting: *Deleted

## 2021-07-05 NOTE — Telephone Encounter (Signed)
Patient had a MRI left shoulder w/o contrast on 03/23/21. See results in imaging. I cancelled your 07/03/21 MRI order.

## 2021-07-05 NOTE — Addendum Note (Signed)
Addended by: Merrilyn Puma on: 07/05/2021 12:52 PM   Modules accepted: Orders

## 2021-07-08 ENCOUNTER — Telehealth: Payer: Self-pay

## 2021-07-08 NOTE — Telephone Encounter (Signed)
New number for referral  [phone]

## 2021-07-08 NOTE — Telephone Encounter (Addendum)
FYI:  I spoke to pt- he states he does not want any injections. He is willing to try 1 session of shockwave therapy as of right now. Transferred pt to [facility] to schedule. Once she informed him of the $60 cost, patient declined therapy and scheduled a f/u to discuss his condition further.

## 2021-07-08 NOTE — Telephone Encounter (Signed)
Can pursue injection or shockwave for ongoing shoulder pain with recent MRI.   Myra Rude, MD [facility] 07/08/2021, 3:01 PM

## 2021-07-10 ENCOUNTER — Encounter: Payer: Self-pay | Admitting: Family Medicine

## 2021-07-10 ENCOUNTER — Ambulatory Visit (INDEPENDENT_AMBULATORY_CARE_PROVIDER_SITE_OTHER): Payer: 59 | Admitting: Family Medicine

## 2021-07-10 ENCOUNTER — Other Ambulatory Visit: Payer: Self-pay

## 2021-07-10 VITALS — BP 108/80 | Ht 73.5 in | Wt 159.0 lb

## 2021-07-10 DIAGNOSIS — S060X9A Concussion with loss of consciousness of unspecified duration, initial encounter: Secondary | ICD-10-CM | POA: Insufficient documentation

## 2021-07-10 DIAGNOSIS — H532 Diplopia: Secondary | ICD-10-CM | POA: Diagnosis not present

## 2021-07-10 DIAGNOSIS — M75102 Unspecified rotator cuff tear or rupture of left shoulder, not specified as traumatic: Secondary | ICD-10-CM

## 2021-07-10 NOTE — Patient Instructions (Signed)
Good to see you Please try physical therapy  I have sent you to the eye doctor as well.  I can't use the nitro patches for the shoulder since you use it for your heart   Please send me a message in MyChart with any questions or updates.  Please see me back in 2-3 weeks.   --Dr. Jordan Likes
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
## 2021-07-10 NOTE — Assessment & Plan Note (Signed)
Acute on chronic in nature.  Previous MRI showing degenerative changes of the rotator cuff.  Initially thought about the nitro patches with taking nitro orally for his heart.  He wants to hold off on steroid injections. -Counseled on home exercise therapy and supportive care. -Could consider shockwave or PRP.

## 2021-07-17 ENCOUNTER — Ambulatory Visit: Payer: 59 | Admitting: Family Medicine

## 2021-07-25 ENCOUNTER — Telehealth: Payer: Self-pay

## 2021-07-25 ENCOUNTER — Ambulatory Visit (INDEPENDENT_AMBULATORY_CARE_PROVIDER_SITE_OTHER): Payer: 59 | Admitting: Family Medicine

## 2021-07-25 ENCOUNTER — Ambulatory Visit (INDEPENDENT_AMBULATORY_CARE_PROVIDER_SITE_OTHER): Payer: 59

## 2021-07-25 ENCOUNTER — Other Ambulatory Visit: Payer: Self-pay

## 2021-07-25 VITALS — BP 104/76 | HR 68 | Ht 73.5 in

## 2021-07-25 DIAGNOSIS — H532 Diplopia: Secondary | ICD-10-CM | POA: Diagnosis not present

## 2021-07-25 DIAGNOSIS — S060X9A Concussion with loss of consciousness of unspecified duration, initial encounter: Secondary | ICD-10-CM | POA: Diagnosis not present

## 2021-07-25 DIAGNOSIS — S62115A Nondisplaced fracture of triquetrum [cuneiform] bone, left wrist, initial encounter for closed fracture: Secondary | ICD-10-CM

## 2021-07-25 MED ORDER — NORTRIPTYLINE HCL 25 MG PO CAPS: 25.0000 mg | ORAL_CAPSULE | Freq: Every day | ORAL | 2 refills | Status: AC

## 2021-07-25 NOTE — Telephone Encounter (Signed)
Left pt a VM asking to confirm his appointment w/ Dr. Denyse Amass for this afternoon since he had previously been seen by another physician.
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
## 2021-07-25 NOTE — Patient Instructions (Addendum)
Thank you for coming in today.  Plan for vestibular physical therapy. This will help your vision more than anything else I think.  If you have really bad double vision ok to use an eye patch for a while.   Try the nortriptyline medicine at bedtime. This can help with headache.   Please get an Xray today before you leave   Recheck with me in 1 month.   OK to continue with Dr Jordan Likes if you want.

## 2021-07-29 NOTE — Progress Notes (Signed)
Wrist x-ray shows some arthritis changes.  The fracture was hard to see.

## 2021-07-31 ENCOUNTER — Ambulatory Visit: Payer: 59 | Attending: Physical Therapy | Admitting: Physical Therapy

## 2021-08-05 ENCOUNTER — Ambulatory Visit: Payer: 59 | Admitting: Rehabilitative and Restorative Service Providers"
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
## 2021-08-28 ENCOUNTER — Ambulatory Visit: Payer: 59 | Admitting: Family Medicine

## 2022-02-05 ENCOUNTER — Ambulatory Visit (INDEPENDENT_AMBULATORY_CARE_PROVIDER_SITE_OTHER): Payer: 59 | Admitting: Family Medicine

## 2022-02-05 ENCOUNTER — Encounter: Payer: Self-pay | Admitting: Family Medicine

## 2022-02-05 VITALS — BP 105/78 | HR 70 | Temp 98.4°F | Resp 16 | Ht 74.0 in | Wt 146.6 lb

## 2022-02-05 DIAGNOSIS — N3281 Overactive bladder: Secondary | ICD-10-CM | POA: Diagnosis not present

## 2022-02-05 DIAGNOSIS — Z1211 Encounter for screening for malignant neoplasm of colon: Secondary | ICD-10-CM

## 2022-02-05 MED ORDER — OXYBUTYNIN CHLORIDE ER 5 MG PO TB24: 5.0000 mg | ORAL_TABLET | Freq: Every day | ORAL | 2 refills | Status: DC
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
## 2022-02-05 NOTE — Patient Instructions (Addendum)
If you do not hear anything about your referrals in the next 1-2 weeks, call our office and ask for an update.  Let me know if there are cost issues with the new medication. There should not be.   Don't drink too much at once please.   Let us know if you need anything.

## 2022-02-05 NOTE — Addendum Note (Signed)
Addended by: Sharon Seller B on: 02/05/2022 02:49 PM   Modules accepted: Orders

## 2022-02-06 ENCOUNTER — Telehealth: Payer: Self-pay | Admitting: Medical

## 2022-02-06 NOTE — Telephone Encounter (Signed)
Pt called and lvm to return call to schedule an appt 

## 2022-02-06 NOTE — Telephone Encounter (Signed)
Pt states he needs amoxacillin and hydrocodone. Advised pt amoxacillin was not prescribed by [facility]. He stated he need both medications because of dental issues. He will not be able to see a dentist until [place] area, so for now he is trying to manage it himself. Please advise.    [id number] - [place], [place] - [address]  [address], [place] [address]  Phone:  [phone]  Fax:  [phone]

## 2022-02-07 ENCOUNTER — Telehealth: Payer: Self-pay | Admitting: Medical

## 2022-02-07 NOTE — Telephone Encounter (Signed)
Pt states he needs referral to physical therapy across the hall. Stated he discussed this with Dr.Wendling on Wednesday. Please advise.

## 2022-02-07 NOTE — Telephone Encounter (Signed)
He stated it was for his shoulder.

## 2022-02-07 NOTE — Telephone Encounter (Signed)
Pt would like a referral to physical therapy across the hall. Stated he discussed this with dr. Carmelia Roller. Please advise.

## 2022-02-10 NOTE — Telephone Encounter (Signed)
Pt called and unable to lvm 

## 2022-02-11 NOTE — Telephone Encounter (Signed)
Pt called and unable to lvm 

## 2022-02-13 NOTE — Telephone Encounter (Signed)
Appointment made for 02/23

## 2022-02-14 ENCOUNTER — Ambulatory Visit (INDEPENDENT_AMBULATORY_CARE_PROVIDER_SITE_OTHER): Payer: 59 | Admitting: Medical

## 2022-02-14 ENCOUNTER — Other Ambulatory Visit: Payer: Self-pay

## 2022-02-14 ENCOUNTER — Ambulatory Visit (HOSPITAL_BASED_OUTPATIENT_CLINIC_OR_DEPARTMENT_OTHER)
Admission: RE | Admit: 2022-02-14 | Discharge: 2022-02-14 | Disposition: A | Discharge status: Home / Self Care | Payer: 59 | Source: Ambulatory Visit | Attending: Medical | Admitting: Medical

## 2022-02-14 ENCOUNTER — Other Ambulatory Visit (HOSPITAL_BASED_OUTPATIENT_CLINIC_OR_DEPARTMENT_OTHER): Payer: Self-pay

## 2022-02-14 VITALS — BP 120/86 | HR 71 | Temp 98.0°F | Resp 18 | Ht 74.0 in | Wt 149.6 lb

## 2022-02-14 DIAGNOSIS — M25512 Pain in left shoulder: Secondary | ICD-10-CM | POA: Diagnosis present

## 2022-02-14 DIAGNOSIS — M898X1 Other specified disorders of bone, shoulder: Secondary | ICD-10-CM

## 2022-02-14 DIAGNOSIS — K0889 Other specified disorders of teeth and supporting structures: Secondary | ICD-10-CM | POA: Diagnosis not present

## 2022-02-14 DIAGNOSIS — R350 Frequency of micturition: Secondary | ICD-10-CM | POA: Diagnosis not present

## 2022-02-14 LAB — PSA: PSA: 2.31 ng/mL (ref 0.10–4.00)

## 2022-02-14 MED ORDER — AMOXICILLIN-POT CLAVULANATE 875-125 MG PO TABS
1.0000 | ORAL_TABLET | Freq: Two times a day (BID) | ORAL | 0 refills | Status: DC
  Filled 2022-02-14: qty 20, 10d supply, fill #0

## 2022-02-14 MED ORDER — HYDROCODONE-ACETAMINOPHEN 5-325 MG PO TABS
1.0000 | ORAL_TABLET | Freq: Four times a day (QID) | ORAL | 0 refills | Status: AC | PRN
  Filled 2022-02-14: qty 10, 3d supply, fill #0

## 2022-02-14 MED ORDER — AMOXICILLIN-POT CLAVULANATE 875-125 MG PO TABS: 1.0000 | ORAL_TABLET | Freq: Two times a day (BID) | ORAL | 0 refills | Status: DC

## 2022-02-14 NOTE — Patient Instructions (Addendum)
History of left shoulder pain with prior biceps tendon injury as well.  Recent new injury and fall approximately 1 month ago causing shoulder pain.  I do want to get x-ray of both the shoulder and clavicle as these areas are tender on exam you do have some limited range of shoulder motion.  We will go ahead and refer you to sports medicine at the med center.  You have seen Dr. Jordan Likes before.  You may need ultrasound or MRI to evaluate soft tissue of shoulder.  During interim you could do mild range of motion exercises.  Would defer to sports medicine MD as to whether or not you ready for further physical therapy as you do report an injury.  Tooth pain with probable tooth infection.  Appear to have cavities/poor dentition right upper teeth region and left lower tooth region.  Prescribing Augmentin antibiotic and limited number of Norco for pain.  Rx advisement given.  You have any complicating signs/ symptoms as discussed regarding tooth pain and probable infection then be seen in the emergency department.  Psa lab today in light or recent urinary symptoms. Reviewed last visit today. Keep urologist appt.  Follow-up in 2 weeks or sooner if needed.
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
## 2022-02-25 ENCOUNTER — Telehealth: Payer: Self-pay | Admitting: Medical

## 2022-02-25 ENCOUNTER — Ambulatory Visit (INDEPENDENT_AMBULATORY_CARE_PROVIDER_SITE_OTHER): Payer: 59 | Admitting: Medical

## 2022-02-25 VITALS — BP 128/89 | HR 69 | Temp 98.6°F | Resp 18 | Ht 74.0 in | Wt 149.0 lb

## 2022-02-25 DIAGNOSIS — N400 Enlarged prostate without lower urinary tract symptoms: Secondary | ICD-10-CM | POA: Diagnosis not present

## 2022-02-25 DIAGNOSIS — Z1211 Encounter for screening for malignant neoplasm of colon: Secondary | ICD-10-CM | POA: Diagnosis not present

## 2022-02-25 DIAGNOSIS — M25512 Pain in left shoulder: Secondary | ICD-10-CM | POA: Diagnosis not present

## 2022-02-25 DIAGNOSIS — S46212S Strain of muscle, fascia and tendon of other parts of biceps, left arm, sequela: Secondary | ICD-10-CM

## 2022-02-25 DIAGNOSIS — R350 Frequency of micturition: Secondary | ICD-10-CM | POA: Diagnosis not present

## 2022-02-25 DIAGNOSIS — E785 Hyperlipidemia, unspecified: Secondary | ICD-10-CM

## 2022-02-25 DIAGNOSIS — K029 Dental caries, unspecified: Secondary | ICD-10-CM

## 2022-02-25 MED ORDER — AMOXICILLIN-POT CLAVULANATE 875-125 MG PO TABS: 1.0000 | ORAL_TABLET | Freq: Two times a day (BID) | ORAL | 0 refills | Status: AC

## 2022-02-25 NOTE — Progress Notes (Addendum)
Subjective:    Patient ID: Matthew Gaines, male    DOB: [birth year], 61 y.o.   MRN: [id number]  HPI Pt in for follow up.   Pt tells me has appt with sports med on March 06, 2022. Referred for shoulder pain. See last note. Pt states with cold weather his left shoulder hurts worse. Pt has not followed up with Dr Erlinda Hong. Pt also has popeye deformity.   Pt also will be trying to get second opinion from [facility]. He states want to get opinion on potential tx for popeye deformity.    He has appointment with urologist on March 03, 2021. Referred to urologist for overactive bladder type signs/symptoms.   I had written antibiotic for tooth  infection. This is long standing problems and he has not made dental appointment since he states does not have  enough money. Pt states if he gets disability will be able to see dentist.  I had given pt number to [facility] as have seen advertisement and cost might be excessive just for one tooth extraction.   Review of Systems  Constitutional:  Negative for chills, fatigue and fever.  Respiratory:  Negative for cough, chest tightness, shortness of breath and wheezing.   Cardiovascular:  Negative for chest pain and palpitations.  Gastrointestinal:  Negative for abdominal pain.  Musculoskeletal:        Shoulder pain. Lt side. No associate cardiac signs or symptoms.  Skin:  Negative for rash.  Neurological:  Negative for facial asymmetry, light-headedness and headaches.  Psychiatric/Behavioral:  Negative for behavioral problems and confusion.    Past Medical History:  Diagnosis Date   Allergy    Q 2-3 yrs has issues    Anginal pain (HCC)    Arthritis    BPH (benign prostatic hyperplasia)    Chest pain 04/25/2020   Chest pain at rest 04/25/2020   DVT of lower limb, acute (HCC)    GERD (gastroesophageal reflux disease)    past hx    Hyperlipidemia    Overactive bladder    Poor circulation    PVD (peripheral vascular disease) ([place])      Social History    Socioeconomic History   Marital status: Married    Spouse name: Not on file   Number of children: Not on file   Years of education: Not on file   Highest education level: Not on file  Occupational History   Not on file  Tobacco Use   Smoking status: Some Days    Packs/day: 0.50    Years: 20.00    Pack years: 10.00    Types: Cigarettes   Smokeless tobacco: Never  Vaping Use   Vaping Use: Never used  Substance and Sexual Activity   Alcohol use: Yes    Alcohol/week: 20.0 standard drinks    Types: 20 Cans of beer per week    Comment: 1-3 beers a day   Drug use: Not Currently    Types: Cocaine    Comment: March 2022   Sexual activity: Not on file  Other Topics Concern   Not on file  Social History Narrative   Not on file   Social Determinants of Health   Financial Resource Strain: Not on file  Food Insecurity: Not on file  Transportation Needs: Not on file  Physical Activity: Not on file  Stress: Not on file  Social Connections: Not on file  Intimate Partner Violence: Not on file    Past Surgical History:  Procedure  Laterality Date   CARDIAC CATHETERIZATION  1999   CARDIAC CATHETERIZATION  1999   CIRCUMCISION     CORONARY ANGIOPLASTY WITH STENT PLACEMENT  1999   LASER ABLATION OF VASCULAR LESION     SHOULDER ARTHROSCOPY WITH BICEPSTENOTOMY Left 10/31/2020   Procedure: SHOULDER ARTHROSCOPY WITH BICEPSTENOTOMY;  Surgeon: Leandrew Koyanagi, MD;  Location: [place];  Service: Orthopedics;  Laterality: Left;   VASCULAR SURGERY      Family History  Problem Relation Age of Onset   Lung cancer Mother    Colon polyps Brother    Colon cancer Neg Hx    Esophageal cancer Neg Hx    Rectal cancer Neg Hx    Stomach cancer Neg Hx     Allergies  Allergen Reactions   Meloxicam Other (See Comments)    Chest pain     Methocarbamol Other (See Comments)    Chest pain     Current Outpatient Medications on File Prior to Visit  Medication Sig Dispense  Refill   aspirin 81 MG chewable tablet Chew 81 mg by mouth daily.      Blood Pressure KIT 1 kit by Does not apply route in the morning and at bedtime. 1 kit 0   busPIRone (BUSPAR) 7.5 MG tablet Take 1 tablet (7.5 mg total) by mouth 2 (two) times daily. 60 tablet 5   celecoxib (CELEBREX) 200 MG capsule Take 1 capsule (200 mg total) by mouth 2 (two) times daily. 20 capsule 0   HYDROcodone-acetaminophen (NORCO) 5-325 MG tablet Take 1 tablet by mouth every 6 (six) hours as needed for moderate pain. 10 tablet 0   Hyprom-Naphaz-Polysorb-Zn Sulf (CLEAR EYES COMPLETE OP) Place 1 drop into both eyes daily as needed (for dry eyes).      Menthol-Methyl Salicylate (MUSCLE RUB) 10-15 % CREA Apply 1 application topically as needed for muscle pain.     Multiple Vitamin (MULTIVITAMIN WITH MINERALS) TABS tablet Take 1 tablet by mouth daily.     nortriptyline (PAMELOR) 25 MG capsule Take 1 capsule (25 mg total) by mouth at bedtime. 30 capsule 2   Omega-3 Fatty Acids (FISH OIL) 1000 MG CAPS Take 1,000 mg by mouth daily.     ondansetron (ZOFRAN) 4 MG tablet Take 1-2 tablets (4-8 mg total) by mouth every 8 (eight) hours as needed for nausea or vomiting. 20 tablet 0   oxybutynin (DITROPAN-XL) 5 MG 24 hr tablet Take 1 tablet (5 mg total) by mouth at bedtime. 30 tablet 2   pantoprazole (PROTONIX) 40 MG tablet Take 1 tablet (40 mg total) by mouth daily. 30 tablet 0   rosuvastatin (CRESTOR) 5 MG tablet Take 5 mg by mouth daily.     tamsulosin (FLOMAX) 0.4 MG CAPS capsule TAKE 1 CAPSULE BY MOUTH EVERY DAY (Patient taking differently: Take 0.4 mg by mouth daily.) 90 capsule 1   tizanidine (ZANAFLEX) 2 MG capsule Take 1 capsule (2 mg total) by mouth 2 (two) times daily as needed for muscle spasms. 20 capsule 0   nitroGLYCERIN (NITROSTAT) 0.4 MG SL tablet Place 1 tablet (0.4 mg total) under the tongue every 5 (five) minutes as needed. 25 tablet 3   No current facility-administered medications on file prior to visit.    BP  128/89    Pulse 69    Temp 98.6 F (37 C)    Resp 18    Ht '6\' 2"'  (1.88 m)    Wt 149 lb (67.6 kg)    SpO2 97%  BMI 19.13 kg/m       Objective:   Physical Exam  General- No acute distress. Pleasant patient. Neck- Full range of motion, no jvd Lungs- Clear, even and unlabored. Heart- regular rate and rhythm. Neurologic- CNII- XII grossly intact.  Left shoulder-   on range of motion pt can't abduct his left arm beyond shoulder level. Pain on palpation anterior aspect and top.  Left clavicle- pain on palpation clavicle area toward left shoulder. Abdomen-soft, nt, nd, +bs. Back- no cva tenderness.  Mouth-upon inspection left upper teeth area/molar region has obvious caries present.  The gum tissue around this area is not swollen.  Lymph nodes in neck are not swollen.    Assessment & Plan:   Patient Instructions  Left shoulder pain-see last visit note.  Sports medicine referral made and upcoming appointment next week.  Please keep that appointment.  Also you expressed that you are trying to get a second opinion regarding your Popeye deformity left upper extremity.  Prior orthopedist Dr. Erlinda Hong.  Placed referral to [facility].  Stressed that I do want you to keep appointment with sports medicine so they can evaluate your shoulder and determine if you can start physical therapy.  BPH and overactive bladder type signs and symptoms.  Continue current medication.  Placed order for urinalysis and urine culture.  Please give sample in our lab.  Keep upcoming urology appointment.   Hyperlipidemia-metabolic panel and lipid panel today.  Presently on Crestor.  Chronic left upper tooth/molar has been intermittently infected.  Obvious caries on exam.  Presently by exam not convinced that you have overt infection.  I have given antibiotics various times and strongly encouraged recommend dentist evaluation.  Recommend that you try to call [facility] to see if they could extract that tooth.   Making Augmentin antibiotic available again if you have recurrent pain in the left upper tooth.  Rx advisement given regarding side effects of antibiotics.  Also regarding your referral for screening colonoscopy.  I would recommend that you go upstairs to the third floor today and talk with receptionist at the GI office.  Follow-up in 1 month or sooner if needed.

## 2022-02-25 NOTE — Patient Instructions (Addendum)
Left shoulder pain-see last visit note.  Sports medicine referral made and upcoming appointment next week.  Please keep that appointment.  Also you expressed that you are trying to get a second opinion regarding your Popeye deformity left upper extremity.  Prior orthopedist Dr. Erlinda Hong.  Placed referral to [facility] orthoped[facility].  Stressed that I do want you to keep appointment with sports medicine so they can evaluate your shoulder and determine if you can start physical therapy. ? ?BPH and overactive bladder type signs and symptoms.  Continue current medication.  Placed order for urinalysis and urine culture.  Please give sample in our lab.  Keep upcoming urology appointment. ? ? ?Hyperlipidemia-metabolic panel and lipid panel today.  Presently on Crestor. ? 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  Obvious caries on exam.  Presently by exam not convinced that you have overt infection.  I have given antibiotics various times and strongly encouraged recommend dentist evaluation.  Recommend that you try to call [facility] to see if they could extract that tooth.  Making Augmentin antibiotic available again if you have recurrent pain in the left upper tooth.  Rx advisement given regarding side effects of antibiotics. ? ?Also regarding your referral for screening colonoscopy.  I would recommend that you go upstairs to the third floor today and talk with receptionist at the GI office. ? ?Follow-up in 1 month or sooner if needed. ? ?Patient states he could not get labs and urine studies today.  He stated he was in a hurry to get to a very important appointment.  So I asked him to stop at the checkout desk and schedule a future labs to get done within a week.  Patient states that he would do so. ? ?

## 2022-02-25 NOTE — Telephone Encounter (Signed)
Pt states [facility] does not accept his insurance. He will need a new ortho referral placed. Please advise.  ?

## 2022-03-03 ENCOUNTER — Ambulatory Visit: Payer: 59 | Admitting: Family Medicine

## 2022-03-04 ENCOUNTER — Encounter: Payer: Self-pay | Admitting: Orthopaedic Surgery

## 2022-03-04 ENCOUNTER — Other Ambulatory Visit: Payer: Self-pay

## 2022-03-04 ENCOUNTER — Ambulatory Visit (INDEPENDENT_AMBULATORY_CARE_PROVIDER_SITE_OTHER): Payer: Self-pay | Admitting: Orthopaedic Surgery

## 2022-03-04 DIAGNOSIS — M19012 Primary osteoarthritis, left shoulder: Secondary | ICD-10-CM

## 2022-03-04 DIAGNOSIS — Z9889 Other specified postprocedural states: Secondary | ICD-10-CM

## 2022-03-04 DIAGNOSIS — G8929 Other chronic pain: Secondary | ICD-10-CM

## 2022-03-04 DIAGNOSIS — M25512 Pain in left shoulder: Secondary | ICD-10-CM
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
## 2022-03-05 ENCOUNTER — Other Ambulatory Visit: Payer: 59

## 2022-03-05 ENCOUNTER — Other Ambulatory Visit: Payer: Self-pay

## 2022-03-05 NOTE — Telephone Encounter (Signed)
error 

## 2022-03-11 ENCOUNTER — Ambulatory Visit: Payer: Self-pay | Admitting: Family Medicine

## 2022-04-18 ENCOUNTER — Other Ambulatory Visit (HOSPITAL_COMMUNITY): Payer: Self-pay

## 2023-02-19 NOTE — Telephone Encounter (Signed)
Error

## 2023-03-16 IMAGING — MR MR SHOULDER*L* W/O CM
6 series · 40 of 40 positions shown · non-contrast
Comparison: 03/03/2020

CLINICAL DATA: Chronic left shoulder pain with weakness in the left
arm. Prior rotator cuff surgery in October 2020. Limited range of
motion.

EXAM:
MRI OF THE LEFT SHOULDER WITHOUT CONTRAST
TECHNIQUE: Multiplanar, multisequence MR imaging of the shoulder was performed.
No intravenous contrast was administered.

[Series 4: T2 fat-sat · axial · 4.0mm · 0.55mm/px · z∈[-41,+60]mm · 8 of 22 slices shown (1 of 3)]
[im 1/22]
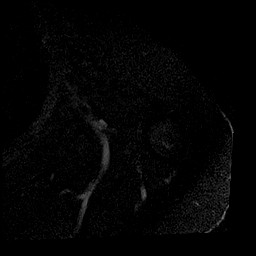
[im 4/22]
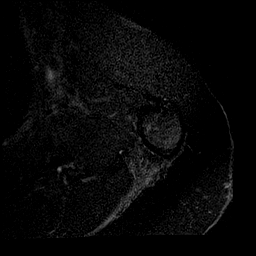
[im 7/22]
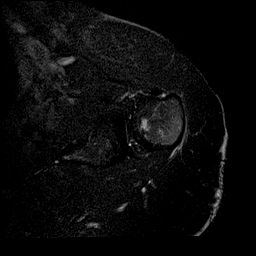
[im 10/22]
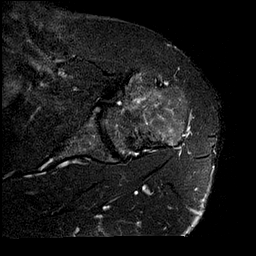
[im 13/22]
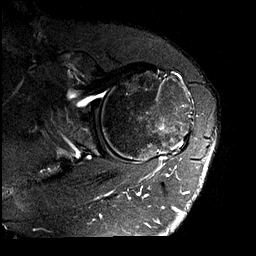
[im 16/22]
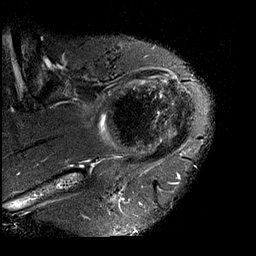
[im 19/22]
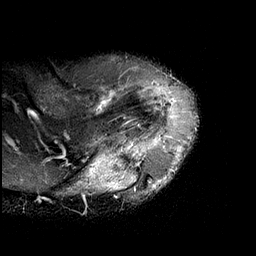
[im 22/22]
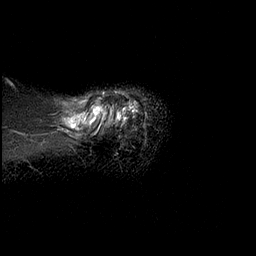

[Series 5: T2 fat-sat · oblique · 4.0mm · 0.55mm/px · 8 of 20 slices shown (2 of 3)]
[im 1/20]
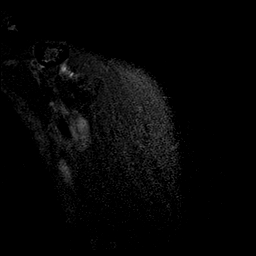
[im 3/20]
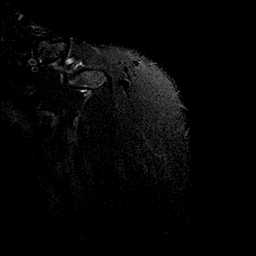
[im 6/20]
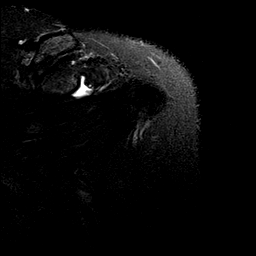
[im 9/20]
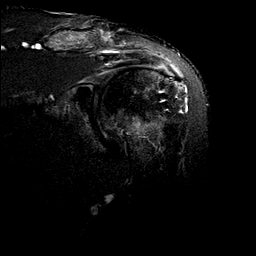
[im 11/20]
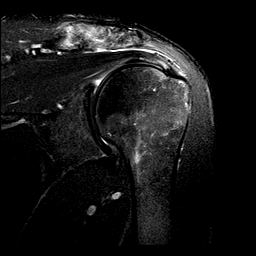
[im 14/20]
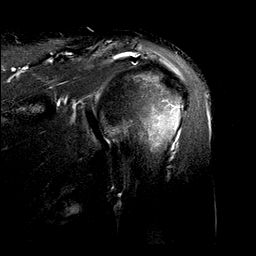
[im 17/20]
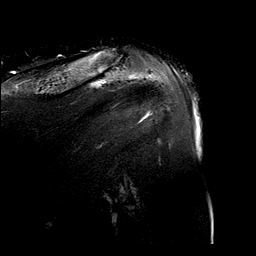
[im 20/20]
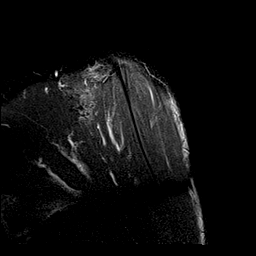

[Series 6: PD · oblique · 4.0mm · 0.55mm/px · 8 of 20 slices shown]
[im 1/20]
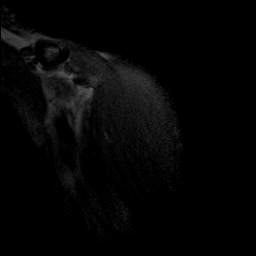
[im 3/20]
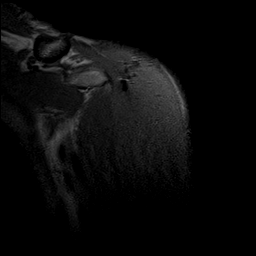
[im 6/20]
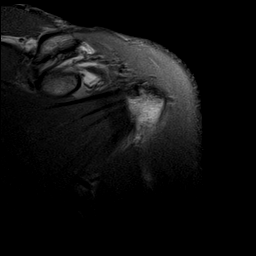
[im 9/20]
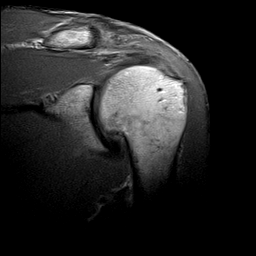
[im 11/20]
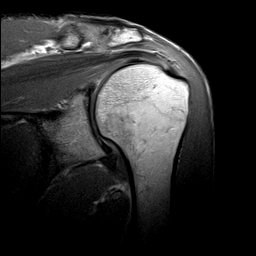
[im 14/20]
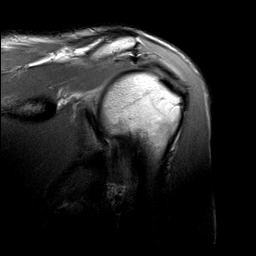
[im 17/20]
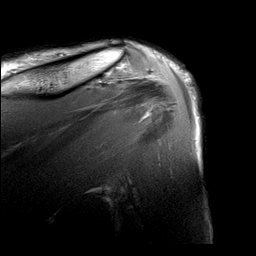
[im 20/20]
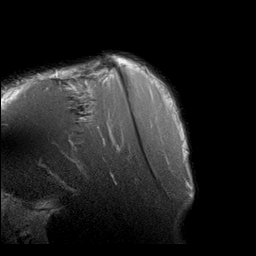

[Series 7: T2 fat-sat · oblique · 4.0mm · 0.55mm/px · 7 of 18 slices shown (3 of 3)]
[im 1/18]
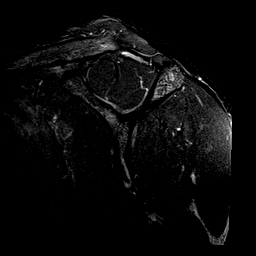
[im 3/18]
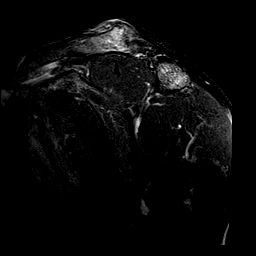
[im 6/18]
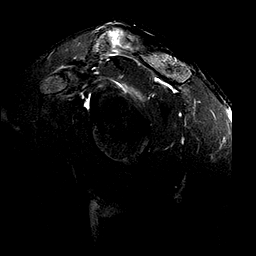
[im 9/18]
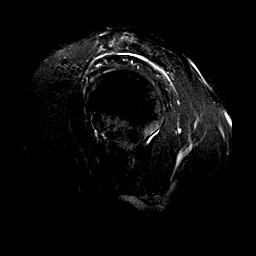
[im 12/18]
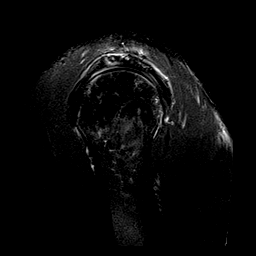
[im 15/18]
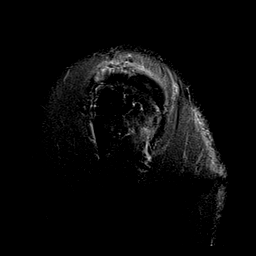
[im 18/18]
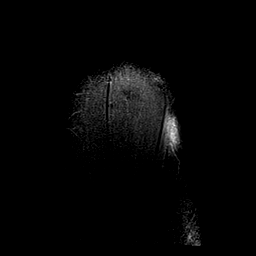

[Series 8: T1 · oblique · 4.0mm · 0.55mm/px · 7 of 18 slices shown]
[im 1/18]
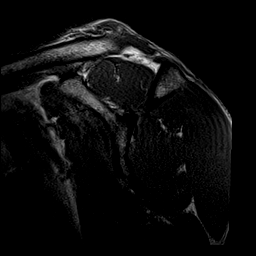
[im 3/18]
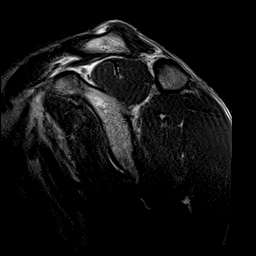
[im 6/18]
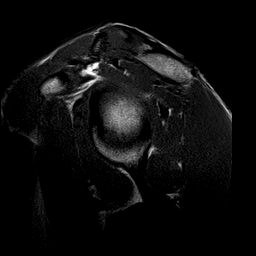
[im 9/18]
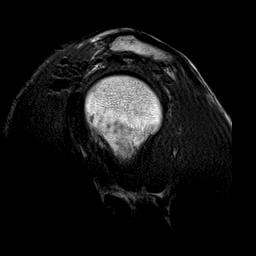
[im 12/18]
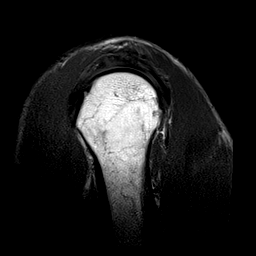
[im 15/18]
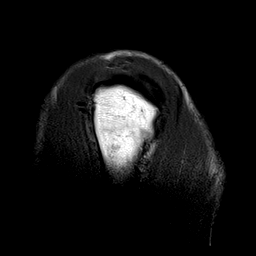
[im 18/18]
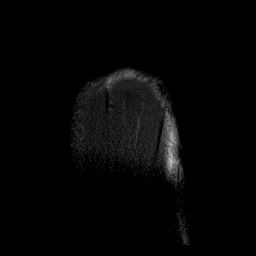

[Series 100: hx · axial · 8.0mm · 0.86mm/px · z∈[-36,+24]mm · 2 of 6 slices shown]
[im 1/6]
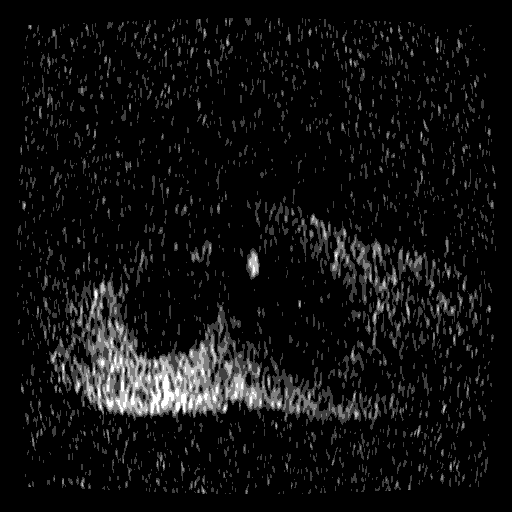
[im 6/6]
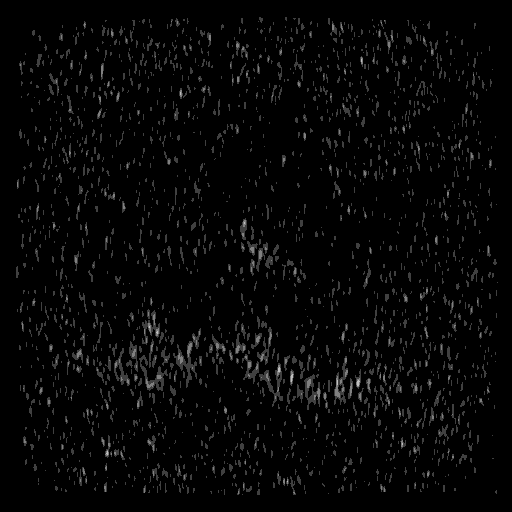

[40 of 40 positions shown; findings below may reference images not displayed]

FINDINGS: Rotator cuff: Moderate supraspinatus tendinopathy with tiny specks
of susceptibility artifact along the supraspinatus tendon and in the
anterior deltoid likely a manifestation of prior debridement/repair.
The subscapularis and infraspinatus tendons appear intact.

Muscles: Tiny speckled foci of susceptibility noted in the anterior
and posterior deltoid muscles likely related to prior operative
intervention. Currently no significant regional muscular edema or
atrophy.

Biceps long head: Poor visualization of the intra-articular segment
of the long head compatible with patient's history of tenotomy.

Acromioclavicular Joint: Suspected prior acromioplasty and distal
clavicle excision, small amount of fluid in the AC joint. Type II
acromion. Trace subacromial subdeltoid bursitis.

Glenohumeral Joint: Reasonably preserved articular cartilage with
minimal spurring of the humeral head. No joint effusion or
significant synovitis is observed.

Labrum:  Grossly unremarkable

Bones: No significant extra-articular osseous abnormalities
identified.

Other: No supplemental non-categorized findings.
IMPRESSION: 1. Moderate supraspinatus tendinopathy, with signs of prior
debridement/repair.
2. Prior biceps tenotomy.
3. Prior acromioplasty with distal clavicular resection.
4. Trace subacromial subdeltoid bursitis.
5. No full-thickness rotator cuff tear is observed.

## 2023-04-09 ENCOUNTER — Encounter: Payer: Self-pay | Admitting: *Deleted

## 2023-05-01 ENCOUNTER — Other Ambulatory Visit: Payer: Self-pay | Admitting: Family Medicine

## 2023-05-01 DIAGNOSIS — N3281 Overactive bladder: Secondary | ICD-10-CM

## 2023-05-02 NOTE — Telephone Encounter (Signed)
Rx refill sent to pt pharmacy 

## 2023-05-07 ENCOUNTER — Encounter: Payer: Medicaid Other | Admitting: Medical
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
## 2023-05-12 ENCOUNTER — Encounter: Payer: Medicaid Other | Admitting: Medical
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
## 2023-05-21 ENCOUNTER — Encounter: Payer: Medicaid Other | Admitting: Medical

## 2023-05-21 ENCOUNTER — Telehealth: Payer: Self-pay

## 2023-05-21 NOTE — Telephone Encounter (Signed)
No show letter completed.

## 2023-08-11 ENCOUNTER — Other Ambulatory Visit: Payer: Self-pay | Admitting: Medical

## 2023-08-11 DIAGNOSIS — N3281 Overactive bladder: Secondary | ICD-10-CM

## 2023-08-14 NOTE — Progress Notes (Signed)
This encounter was created in error - please disregard.

## 2024-02-15 DIAGNOSIS — F10939 Alcohol use, unspecified with withdrawal, unspecified: Secondary | ICD-10-CM | POA: Diagnosis not present

## 2024-02-18 DIAGNOSIS — F10939 Alcohol use, unspecified with withdrawal, unspecified: Secondary | ICD-10-CM | POA: Diagnosis not present

## 2024-02-19 DIAGNOSIS — F10939 Alcohol use, unspecified with withdrawal, unspecified: Secondary | ICD-10-CM | POA: Diagnosis not present

## 2024-03-02 DIAGNOSIS — F10939 Alcohol use, unspecified with withdrawal, unspecified: Secondary | ICD-10-CM | POA: Diagnosis not present

## 2024-03-04 DIAGNOSIS — F10939 Alcohol use, unspecified with withdrawal, unspecified: Secondary | ICD-10-CM | POA: Diagnosis not present
